# Patient Record
Sex: Female | Born: 1985 | Race: Black or African American | Hispanic: No | Marital: Single | Smoking: Former smoker
Health system: Southern US, Community
[De-identification: ages and names within clinical notes are randomized; demographics above are authoritative.]

## PROBLEM LIST (undated history)

## (undated) DIAGNOSIS — F419 Anxiety disorder, unspecified: Secondary | ICD-10-CM

## (undated) DIAGNOSIS — S37039A Laceration of unspecified kidney, unspecified degree, initial encounter: Secondary | ICD-10-CM

## (undated) DIAGNOSIS — R51 Headache: Secondary | ICD-10-CM

## (undated) DIAGNOSIS — F329 Major depressive disorder, single episode, unspecified: Secondary | ICD-10-CM

## (undated) DIAGNOSIS — Q8500 Neurofibromatosis, unspecified: Secondary | ICD-10-CM

## (undated) DIAGNOSIS — S82891A Other fracture of right lower leg, initial encounter for closed fracture: Secondary | ICD-10-CM

## (undated) DIAGNOSIS — R519 Headache, unspecified: Secondary | ICD-10-CM

## (undated) DIAGNOSIS — F32A Depression, unspecified: Secondary | ICD-10-CM

## (undated) HISTORY — DX: Laceration of unspecified kidney, unspecified degree, initial encounter: S37.039A

## (undated) HISTORY — DX: Headache: R51

## (undated) HISTORY — DX: Headache, unspecified: R51.9

## (undated) HISTORY — DX: Depression, unspecified: F32.A

## (undated) HISTORY — DX: Major depressive disorder, single episode, unspecified: F32.9

## (undated) HISTORY — DX: Other fracture of right lower leg, initial encounter for closed fracture: S82.891A

## (undated) HISTORY — DX: Anxiety disorder, unspecified: F41.9

---
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
## 2006-08-31 ENCOUNTER — Emergency Department (HOSPITAL_COMMUNITY): Admission: EM | Admit: 2006-08-31 | Discharge: 2006-09-01 | Payer: Self-pay | Admitting: Emergency Medicine

## 2006-10-11 ENCOUNTER — Inpatient Hospital Stay (HOSPITAL_COMMUNITY): Admission: AD | Admit: 2006-10-11 | Discharge: 2006-10-11 | Payer: Self-pay | Admitting: Obstetrics

## 2006-11-10 DIAGNOSIS — O1410 Severe pre-eclampsia, unspecified trimester: Secondary | ICD-10-CM

## 2010-05-12 LAB — SICKLE CELL SCREEN: Sickle Cell Screen: NORMAL

## 2013-05-06 ENCOUNTER — Encounter (HOSPITAL_COMMUNITY): Payer: Self-pay | Admitting: Emergency Medicine

## 2013-05-06 ENCOUNTER — Emergency Department (INDEPENDENT_AMBULATORY_CARE_PROVIDER_SITE_OTHER)
Admission: EM | Admit: 2013-05-06 | Discharge: 2013-05-06 | Disposition: A | Discharge status: Home / Self Care | Payer: Medicaid Other | Source: Home / Self Care

## 2013-05-06 DIAGNOSIS — R110 Nausea: Secondary | ICD-10-CM

## 2013-05-06 DIAGNOSIS — R109 Unspecified abdominal pain: Secondary | ICD-10-CM

## 2013-05-06 LAB — LIPASE, BLOOD: Lipase: 20 U/L (ref 11–59)

## 2013-05-06 LAB — POCT PREGNANCY, URINE: Preg Test, Ur: NEGATIVE

## 2013-05-06 LAB — POCT I-STAT, CHEM 8
BUN: 7 mg/dL (ref 6–23)
Chloride: 107 mEq/L (ref 96–112)
Creatinine, Ser: 0.7 mg/dL (ref 0.50–1.10)
Glucose, Bld: 103 mg/dL — ABNORMAL HIGH (ref 70–99)
HCT: 37 % (ref 36.0–46.0)
Potassium: 4 mEq/L (ref 3.5–5.1)
Sodium: 140 mEq/L (ref 135–145)

## 2013-05-06 LAB — HEPATIC FUNCTION PANEL
AST: 11 U/L (ref 0–37)
Albumin: 3.7 g/dL (ref 3.5–5.2)
Total Protein: 7.2 g/dL (ref 6.0–8.3)

## 2013-05-06 MED ORDER — ONDANSETRON 4 MG PO TBDP: 4.0000 mg | ORAL_TABLET | Freq: Three times a day (TID) | ORAL | Status: DC | PRN

## 2013-05-06 NOTE — ED Notes (Signed)
Pt c/o abdominal cramping, nausea, and headaches x 1 day. Yesterday had a bowel movement and feces was white and pale. Felt very lightheaded and dizzy after bowel movement. No changes in diet. Patient is alert and oriented.

## 2013-05-06 NOTE — ED Provider Notes (Signed)
History     CSN: [id number]  Arrival date & time 05/06/13  1325   None     Chief Complaint  Patient presents with  . GI Problem    (Consider location/radiation/quality/duration/timing/severity/associated sxs/prior treatment) HPI Comments: Pt presents c/o nausea, abdominal cramping, abdominal pain since yesterday.  States yesterday she had a stool that was white/gray and that today it was very pasty and abnormal.  She has never had anything like this happen before with her.  Denies any vomiting, fever, chills, SOB, swelling in feet or hands.  She is sexually active in the past 3 weeks.    Patient is a 27 y.o. female presenting with GI illness.  GI Problem    History reviewed. No pertinent past medical history.  History reviewed. No pertinent past surgical history.  No family history on file.  History  Substance Use Topics  . Smoking status: Current Some Day Smoker -- 0.00 packs/day    Types: Cigarettes  . Smokeless tobacco: Not on file  . Alcohol Use: Yes     Comment: occasionally    OB History   Grav Para Term Preterm Abortions TAB SAB Ect Mult Living                  Review of Systems  Constitutional: Negative.  Negative for chills and fatigue.  HENT: Negative.   Eyes: Negative.   Respiratory: Negative.   Cardiovascular: Negative.   Gastrointestinal: Positive for abdominal distention (cramping ).       See HPI   Endocrine: Negative.   Genitourinary: Negative.   Musculoskeletal: Negative.   Skin: Negative.   Allergic/Immunologic: Negative.   Neurological: Negative.   Hematological: Negative.   Psychiatric/Behavioral: Negative.     Allergies  Review of patient's allergies indicates no known allergies.  Home Medications   Current Outpatient Rx  Name  Route  Sig  Dispense  Refill  . ondansetron (ZOFRAN-ODT) 4 MG disintegrating tablet   Oral   Take 1 tablet (4 mg total) by mouth every 8 (eight) hours as needed for nausea.   10 tablet   0     BP  118/57  Pulse 66  Temp(Src) 98.8 F (37.1 C) (Oral)  Resp 16  SpO2 100%  LMP 04/09/2013  Physical Exam  Nursing note and vitals reviewed. Constitutional: She is oriented to person, place, and time. Vital signs are normal. She appears well-developed and well-nourished. No distress.  HENT:  Head: Normocephalic and atraumatic.  Right Ear: External ear normal.  Left Ear: External ear normal.  Nose: Nose normal.  Eyes: Conjunctivae and EOM are normal. Pupils are equal, round, and reactive to light. Right eye exhibits no discharge. Left eye exhibits no discharge.  Neck: Normal range of motion. Neck supple. No JVD present. No tracheal deviation present.  Cardiovascular: Normal rate, regular rhythm and normal heart sounds.  Exam reveals no gallop and no friction rub.   No murmur heard. Pulmonary/Chest: Effort normal and breath sounds normal. No stridor. No respiratory distress. She has no wheezes. She has no rales. She exhibits no tenderness.  Abdominal: Soft. Normal appearance and bowel sounds are normal. She exhibits no distension. There is no hepatosplenomegaly. There is tenderness in the epigastric area and left lower quadrant. There is no guarding, no CVA tenderness, no tenderness at McBurney's point and negative Murphy's sign.  Musculoskeletal: Normal range of motion. She exhibits no edema and no tenderness.  Neurological: She is alert and oriented to person, place, and time. She has  normal strength. No cranial nerve deficit.  Skin: Skin is warm and dry. No rash noted. She is not diaphoretic. No erythema. No pallor.  Psychiatric: She has a normal mood and affect. Her behavior is normal. Judgment normal.    ED Course  Procedures (including critical care time)  Labs Reviewed  POCT I-STAT, CHEM 8 - Abnormal; Notable for the following:    Glucose, Bld 103 (*)    All other components within normal limits  HEPATIC FUNCTION PANEL  LIPASE, BLOOD  POCT PREGNANCY, URINE   No results  found.   1. Abdominal pain   2. Nausea       MDM  Pt has benign PE.  No clear reason for her symptoms, suspect some form of mild gastroenteritis.  Will give her Rx for zofran for any nausea and will call her about the labs when they come back.  Clear/soft diet until labs come back.  Return if worsening or f/u with PCP     Graylon Good, PA-C 05/06/13 1608  Graylon Good, PA-C 05/06/13 1711  Addendum: Labs came back normal.  Attempted to call pt to inform but phone number disconnected.     Graylon Good, PA-C 05/07/13 1021

## 2013-05-07 NOTE — ED Provider Notes (Signed)
Medical screening examination/treatment/procedure(s) were performed by resident physician or non-physician practitioner and as supervising physician I was immediately available for consultation/collaboration.   KINDL,JAMES DOUGLAS MD.   James D Kindl, MD 05/07/13 1409 

## 2014-02-27 ENCOUNTER — Emergency Department (HOSPITAL_COMMUNITY): Payer: Medicaid Other

## 2014-02-27 ENCOUNTER — Encounter (HOSPITAL_COMMUNITY): Payer: Self-pay | Admitting: Emergency Medicine

## 2014-02-27 ENCOUNTER — Emergency Department (HOSPITAL_COMMUNITY)
Admission: EM | Admit: 2014-02-27 | Discharge: 2014-02-27 | Disposition: A | Discharge status: Home / Self Care | Payer: Medicaid Other | Attending: Emergency Medicine | Admitting: Emergency Medicine

## 2014-02-27 ENCOUNTER — Emergency Department (HOSPITAL_COMMUNITY)
Admission: EM | Admit: 2014-02-27 | Discharge: 2014-02-27 | Disposition: A | Discharge status: Hospice | Payer: Medicaid Other | Source: Home / Self Care | Attending: Family Medicine | Admitting: Family Medicine

## 2014-02-27 DIAGNOSIS — F17200 Nicotine dependence, unspecified, uncomplicated: Secondary | ICD-10-CM | POA: Insufficient documentation

## 2014-02-27 DIAGNOSIS — R1013 Epigastric pain: Secondary | ICD-10-CM

## 2014-02-27 DIAGNOSIS — R110 Nausea: Secondary | ICD-10-CM | POA: Insufficient documentation

## 2014-02-27 DIAGNOSIS — R109 Unspecified abdominal pain: Secondary | ICD-10-CM

## 2014-02-27 DIAGNOSIS — L259 Unspecified contact dermatitis, unspecified cause: Secondary | ICD-10-CM | POA: Insufficient documentation

## 2014-02-27 LAB — COMPREHENSIVE METABOLIC PANEL
ALT: 10 U/L (ref 0–35)
AST: 12 U/L (ref 0–37)
Albumin: 3.7 g/dL (ref 3.5–5.2)
Alkaline Phosphatase: 78 U/L (ref 39–117)
BUN: 7 mg/dL (ref 6–23)
CALCIUM: 8.9 mg/dL (ref 8.4–10.5)
CO2: 22 meq/L (ref 19–32)
CREATININE: 0.61 mg/dL (ref 0.50–1.10)
Chloride: 106 mEq/L (ref 96–112)
GLUCOSE: 87 mg/dL (ref 70–99)
Potassium: 4 mEq/L (ref 3.7–5.3)
SODIUM: 140 meq/L (ref 137–147)
TOTAL PROTEIN: 7.5 g/dL (ref 6.0–8.3)
Total Bilirubin: 0.2 mg/dL — ABNORMAL LOW (ref 0.3–1.2)

## 2014-02-27 LAB — POCT URINALYSIS DIP (DEVICE)
Glucose, UA: NEGATIVE mg/dL
Ketones, ur: NEGATIVE mg/dL
NITRITE: NEGATIVE
PH: 5.5 (ref 5.0–8.0)
PROTEIN: 100 mg/dL — AB
Specific Gravity, Urine: >=1.03 (ref 1.005–1.030)
UROBILINOGEN UA: 0.2 mg/dL (ref 0.0–1.0)

## 2014-02-27 LAB — CBC WITH DIFFERENTIAL/PLATELET
BASOS ABS: 0 10*3/uL (ref 0.0–0.1)
Basophils Relative: 1 % (ref 0–1)
EOS ABS: 0.1 10*3/uL (ref 0.0–0.7)
EOS PCT: 2 % (ref 0–5)
HEMATOCRIT: 35.9 % — AB (ref 36.0–46.0)
Hemoglobin: 11.6 g/dL — ABNORMAL LOW (ref 12.0–15.0)
Lymphocytes Relative: 29 % (ref 12–46)
Lymphs Abs: 1.9 10*3/uL (ref 0.7–4.0)
MCH: 24.9 pg — AB (ref 26.0–34.0)
MCHC: 32.3 g/dL (ref 30.0–36.0)
MCV: 77 fL — AB (ref 78.0–100.0)
MONO ABS: 0.9 10*3/uL (ref 0.1–1.0)
Monocytes Relative: 13 % — ABNORMAL HIGH (ref 3–12)
Neutro Abs: 3.7 10*3/uL (ref 1.7–7.7)
Neutrophils Relative %: 56 % (ref 43–77)
PLATELETS: 388 10*3/uL (ref 150–400)
RBC: 4.66 MIL/uL (ref 3.87–5.11)
RDW: 16.7 % — AB (ref 11.5–15.5)
WBC: 6.7 10*3/uL (ref 4.0–10.5)

## 2014-02-27 LAB — URINALYSIS, ROUTINE W REFLEX MICROSCOPIC
BILIRUBIN URINE: NEGATIVE
Glucose, UA: NEGATIVE mg/dL
Hgb urine dipstick: NEGATIVE
Ketones, ur: NEGATIVE mg/dL
NITRITE: NEGATIVE
PH: 7 (ref 5.0–8.0)
Protein, ur: NEGATIVE mg/dL
SPECIFIC GRAVITY, URINE: 1.025 (ref 1.005–1.030)
UROBILINOGEN UA: 1 mg/dL (ref 0.0–1.0)

## 2014-02-27 LAB — POCT PREGNANCY, URINE: Preg Test, Ur: NEGATIVE

## 2014-02-27 LAB — LIPASE, BLOOD: Lipase: 15 U/L (ref 11–59)

## 2014-02-27 LAB — URINE MICROSCOPIC-ADD ON

## 2014-02-27 MED ORDER — GI COCKTAIL ~~LOC~~
30.0000 mL | Freq: Once | ORAL | Status: AC
  Administered 2014-02-27: 30 mL via ORAL
  Filled 2014-02-27: qty 30

## 2014-02-27 MED ORDER — SULFAMETHOXAZOLE-TRIMETHOPRIM 800-160 MG PO TABS: 1.0000 | ORAL_TABLET | Freq: Two times a day (BID) | ORAL | Status: AC

## 2014-02-27 MED ORDER — OMEPRAZOLE 20 MG PO CPDR: 20.0000 mg | DELAYED_RELEASE_CAPSULE | Freq: Every day | ORAL | Status: DC

## 2014-02-27 MED ORDER — ACETAMINOPHEN 325 MG PO TABS
650.0000 mg | ORAL_TABLET | Freq: Once | ORAL | Status: AC
  Administered 2014-02-27: 650 mg via ORAL
  Filled 2014-02-27: qty 2

## 2014-02-27 NOTE — ED Notes (Signed)
C/o upper mid abd pain States as if food is not digesting  Feels as if a heavy weight is on the center of her abd area radiating upward under breast No medications or treatments tried No distress at this time

## 2014-02-27 NOTE — ED Provider Notes (Signed)
Medical screening examination/treatment/procedure(s) were performed by resident physician or non-physician practitioner and as supervising physician I was immediately available for consultation/collaboration.   KINDL,JAMES DOUGLAS MD.   James D Kindl, MD 02/27/14 1745 

## 2014-02-27 NOTE — Discharge Instructions (Signed)
Abdominal Pain, Adult °Many things can cause abdominal pain. Usually, abdominal pain is not caused by a disease and will improve without treatment. It can often be observed and treated at home. Your health care provider will do a physical exam and possibly order blood tests and X-rays to help determine the seriousness of your pain. However, in many cases, more time must pass before a clear cause of the pain can be found. Before that point, your health care provider may not know if you need more testing or further treatment. °HOME CARE INSTRUCTIONS  °Monitor your abdominal pain for any changes. The following actions may help to alleviate any discomfort you are experiencing: °· Only take over-the-counter or prescription medicines as directed by your health care provider. °· Do not take laxatives unless directed to do so by your health care provider. °· Try a clear liquid diet (broth, tea, or water) as directed by your health care provider. Slowly move to a bland diet as tolerated. °SEEK MEDICAL CARE IF: °· You have unexplained abdominal pain. °· You have abdominal pain associated with nausea or diarrhea. °· You have pain when you urinate or have a bowel movement. °· You experience abdominal pain that wakes you in the night. °· You have abdominal pain that is worsened or improved by eating food. °· You have abdominal pain that is worsened with eating fatty foods. °SEEK IMMEDIATE MEDICAL CARE IF:  °· Your pain does not go away within 2 hours. °· You have a fever. °· You keep throwing up (vomiting). °· Your pain is felt only in portions of the abdomen, such as the right side or the left lower portion of the abdomen. °· You pass bloody or black tarry stools. °MAKE SURE YOU: °· Understand these instructions.   °· Will watch your condition.   °· Will get help right away if you are not doing well or get worse.   °Document Released: 09/12/2005 Document Revised: 09/23/2013 Document Reviewed: 08/12/2013 °ExitCare® Patient  Information ©2014 [employer]. ° °

## 2014-02-27 NOTE — ED Notes (Signed)
Pt informed to update number with registration at discharge desk, in case pt needed new abx from urine culture.

## 2014-02-27 NOTE — ED Notes (Signed)
Pt c/o diffuse upper abdominal pain described as "heaviness" after eating food for the past 3 weeks associated with intermittent nausea. Pt denies CP, SOB, blurry vision, weakness, vomiting and diarrhea. [facility]. Pt in NAD, respirations equal and labored, steady gait, skin warm and dry.

## 2014-02-27 NOTE — ED Notes (Signed)
Pt at U/S

## 2014-02-27 NOTE — ED Provider Notes (Signed)
CSN: [id number]     Arrival date & time 02/27/14  1157 History   First MD Initiated Contact with Patient 02/27/14 1213     Chief Complaint  Patient presents with  . Abdominal Pain     (Consider location/radiation/quality/duration/timing/severity/associated sxs/prior Treatment) Patient is a 28 y.o. female presenting with abdominal pain. The history is provided by the patient. No language interpreter was used.  Abdominal Pain Pain location:  Epigastric Pain quality: aching   Pain radiates to:  Does not radiate Duration:  2 weeks Timing:  Intermittent Context: not previous surgeries, not recent illness, not sick contacts and not suspicious food intake   Associated symptoms: nausea   Associated symptoms: no chills, no constipation, no diarrhea, no dysuria, no fatigue, no fever, no hematuria, no shortness of breath and no vomiting    Pt is a 28 year old female who [facility] with complaints of epigastric pain and discomfort. She reports that she has been having an aching sensation in her epigastrum for the last 2 weeks on and off. She reports that she also has had a burning sensation up in to the center of her chest. She reports that it is sometimes worse at night and may be related to food but she is unsure. She denies any associated shortness of breath, difficulty breathing or recent illness. She denies any vomiting, diarrhea or constipation. She reports that when she has this epigastric pain she experiences some mild nausea. She denies radiation of pain into her back. No history of any medical problems. No surgical history.   History reviewed. No pertinent past medical history. History reviewed. No pertinent past surgical history. No family history on file. History  Substance Use Topics  . Smoking status: Current Some Day Smoker -- 0.00 packs/day    Types: Cigarettes  . Smokeless tobacco: Not on file  . Alcohol Use: Yes     Comment: occasionally   OB History   Grav Para Term Preterm Abortions TAB SAB Ect Mult Living                 Review of Systems  Constitutional: Negative for fever, chills and fatigue.  Respiratory: Negative for shortness of breath.   Gastrointestinal: Positive for nausea and abdominal pain. Negative for vomiting, diarrhea and constipation.  Genitourinary: Negative for dysuria, hematuria and difficulty urinating.  Musculoskeletal: Negative for back pain.  All other systems reviewed and are negative.      Allergies  Review of patient's allergies indicates no known allergies.  Home Medications   Current Outpatient Rx  Name  Route  Sig  Dispense  Refill  . omeprazole (PRILOSEC) 20 MG capsule   Oral   Take 1 capsule (20 mg total) by mouth daily.   30 capsule   1   . sulfamethoxazole-trimethoprim (BACTRIM DS,SEPTRA DS) 800-160 MG per tablet   Oral   Take 1 tablet by mouth 2 (two) times daily.   6 tablet   0    BP 103/50  Pulse 85  Temp(Src) 99.1 F (37.3 C) (Oral)  Resp 18  SpO2 100%  LMP 02/09/2014 Physical Exam  Nursing note and vitals reviewed. Constitutional: She is oriented to person, place, and time. She appears well-developed and well-nourished.  Well-appearing  HENT:  Head: Normocephalic and atraumatic.  Eyes: Conjunctivae and EOM are normal.  Neck: Normal range of motion. Neck supple. No JVD present. No tracheal deviation present. No thyromegaly present.  Cardiovascular: Normal rate, regular rhythm and normal heart  sounds.   Pulmonary/Chest: Effort normal and breath sounds normal. No respiratory distress. She has no wheezes.  Abdominal: Soft. Normal appearance and bowel sounds are normal. There is tenderness in the epigastric area. There is no rigidity, no rebound, no guarding, no tenderness at McBurney's point and negative Murphy's sign.  Musculoskeletal: Normal range of motion.  Lymphadenopathy:    She has no cervical adenopathy.  Neurological: She is alert and oriented to person, place,  and time. No cranial nerve deficit. Coordination normal.  Skin: Skin is warm and dry.  Left arm, ecchymosis, small and scattered on upper arm. Pt reports due to eczema where she has been scratching.  Psychiatric: She has a normal mood and affect. Her behavior is normal. Judgment and thought content normal.    ED Course  Procedures (including critical care time) Labs Review Labs Reviewed  CBC WITH DIFFERENTIAL - Abnormal; Notable for the following:    Hemoglobin 11.6 (*)    HCT 35.9 (*)    MCV 77.0 (*)    MCH 24.9 (*)    RDW 16.7 (*)    Monocytes Relative 13 (*)    All other components within normal limits  COMPREHENSIVE METABOLIC PANEL - Abnormal; Notable for the following:    Total Bilirubin 0.2 (*)    All other components within normal limits  URINALYSIS, ROUTINE W REFLEX MICROSCOPIC - Abnormal; Notable for the following:    APPearance CLOUDY (*)    Leukocytes, UA LARGE (*)    All other components within normal limits  URINE MICROSCOPIC-ADD ON - Abnormal; Notable for the following:    Squamous Epithelial / LPF MANY (*)    Bacteria, UA MANY (*)    All other components within normal limits  URINE CULTURE  LIPASE, BLOOD   Imaging Review No results found.   EKG Interpretation None      MDM   Final diagnoses:  Epigastric abdominal pain    Patient reports feeling much better after GI cocktail. Negative abdominal ultrasound. Patient is afebrile with benign abdominal exam. Urinalysis consistent with UTI. Discussed diet modifications for GERD. Discussed overall plan of care and patient agrees. Prescriptions given for omeprazole and Bactrim DS. Return precautions given.      Irish Elders, NP 03/02/14 [phone]

## 2014-02-27 NOTE — ED Notes (Signed)
Pt informed [facility] e-signed rx for keflex and pyridum, and pt had option between those meds and bactrim here in ED. RN informed of use and SE. PT educated on GERD and UTI prevention.

## 2014-02-27 NOTE — ED Notes (Signed)
Pt presents to department for evaluation of middle epigastric pain. Ongoing for several months. [facility]. States she feels like food isn't digesting properly. 8/10 pain upon arrival to ED. Pt is alert and oriented x4.

## 2014-02-27 NOTE — ED Notes (Signed)
[facility]see results)

## 2014-02-27 NOTE — ED Notes (Signed)
US called sts appx time 30 min pt updated.

## 2014-02-27 NOTE — ED Provider Notes (Signed)
CSN: [id number]     Arrival date & time 02/27/14  1004 History   First MD Initiated Contact with Patient 02/27/14 1059     Chief Complaint  Patient presents with  . Abdominal Pain  . Gastrophageal Reflux    Patient is a 28 y.o. female presenting with abdominal pain and GERD. The history is provided by the patient.  Abdominal Pain Pain location:  Epigastric Pain quality: bloating, fullness, heavy, pressure and sharp   Pain radiates to:  Does not radiate Pain severity:  Severe Onset quality:  Sudden Duration:  2 weeks Timing:  Intermittent Progression:  Worsening Chronicity:  Recurrent Context: eating   Context: not alcohol use, not awakening from sleep, not diet changes, not laxative use, not medication withdrawal, not previous surgeries, not recent illness, not retching, not sick contacts, not suspicious food intake and not trauma   Relieved by:  Nothing Worsened by:  Eating Associated symptoms: nausea   Associated symptoms: no anorexia, no belching, no constipation, no cough, no diarrhea, no fever and no vomiting   Risk factors: obesity   Risk factors: no aspirin use, not elderly, has not had multiple surgeries, no NSAID use, not pregnant and no recent hospitalization   Gastrophageal Reflux Associated symptoms include abdominal pain.  Pt reports intermittent epigastric area pain after eating that has worsened over the last 2 weeks. She first began to notice this pain in November. Starts soon after eating. States it feels like she is not digesting her food. Pain will last several hours then resolve. Pain is associated with nausea. Denies vomiting, diarrhea or fever.   History reviewed. No pertinent past medical history. History reviewed. No pertinent past surgical history. History reviewed. No pertinent family history. History  Substance Use Topics  . Smoking status: Current Some Day Smoker -- 0.00 packs/day    Types: Cigarettes  . Smokeless tobacco: Not on file  . Alcohol Use:  Yes     Comment: occasionally   OB History   Grav Para Term Preterm Abortions TAB SAB Ect Mult Living                 Review of Systems  Constitutional: Negative for fever.  Respiratory: Negative for cough.   Gastrointestinal: Positive for nausea and abdominal pain. Negative for vomiting, diarrhea, constipation, abdominal distention and anorexia.  All other systems reviewed and are negative.    Allergies  Review of patient's allergies indicates no known allergies.  Home Medications   Current Outpatient Rx  Name  Route  Sig  Dispense  Refill  . omeprazole (PRILOSEC) 20 MG capsule   Oral   Take 1 capsule (20 mg total) by mouth daily.   30 capsule   1   . sulfamethoxazole-trimethoprim (BACTRIM DS,SEPTRA DS) 800-160 MG per tablet   Oral   Take 1 tablet by mouth 2 (two) times daily.   6 tablet   0    BP 138/75  Pulse 79  Temp(Src) 98.4 F (36.9 C) (Oral)  Resp 18  SpO2 100%  LMP 02/09/2014 Physical Exam  Constitutional: She is oriented to person, place, and time. She appears well-developed and well-nourished. No distress.  HENT:  Head: Normocephalic and atraumatic.  Eyes: Conjunctivae are normal.  Cardiovascular: Normal rate and regular rhythm.   Pulmonary/Chest: Effort normal and breath sounds normal.  Abdominal: Soft. Bowel sounds are normal. She exhibits no distension and no mass. There is tenderness in the epigastric area. There is no rigidity, no rebound, no guarding, no tenderness  at McBurney's point and negative Murphy's sign. No hernia.  Neurological: She is alert and oriented to person, place, and time.  Skin: Skin is warm and dry.  Psychiatric: She has a normal mood and affect.    ED Course  Procedures (including critical care time) Labs Review Labs Reviewed  POCT URINALYSIS DIP (DEVICE) - Abnormal; Notable for the following:    Bilirubin Urine SMALL (*)    Hgb urine dipstick SMALL (*)    Protein, ur 100 (*)    Leukocytes, UA LARGE (*)    All  other components within normal limits  POCT PREGNANCY, URINE   Imaging Review [place] Abdomen Complete  02/27/2014   CLINICAL DATA:  Abdominal pain  EXAM: ULTRASOUND ABDOMEN COMPLETE  COMPARISON:  None.  FINDINGS: Gallbladder:  No gallstones or wall thickening visualized. There is no pericholecystic fluid. No sonographic Murphy sign noted.  Common bile duct:  Diameter: 3 mm. There is no intrahepatic, common hepatic, or common bile duct dilatation.  Liver:  No focal lesion identified. Within normal limits in parenchymal echogenicity.  IVC:  No abnormality visualized.  Pancreas:  No appreciable mass or inflammatory focus.  Spleen:  Size and appearance within normal limits.  Right Kidney:  Length: 10.4 cm. Echogenicity within normal limits. No mass or hydronephrosis visualized.  Left Kidney:  Length: 10.3 cm. Echogenicity within normal limits. No mass or hydronephrosis visualized.  Abdominal aorta:  No aneurysm visualized.  Other findings:  No demonstrable ascites.  IMPRESSION: Study within normal limits.   Electronically Signed   By: Bretta BangWilliam  Woodruff M.D.   On: 02/27/2014 14:19     MDM   1. Abdominal pain    4+ month h/o epigastric area pain after eating that is associated w/ nausea w/o vomiting. Pain much worse and persistent over the last 2 weeks. Very TTP over the epigastric region. No known h/o GERD. Suspicious for Cholelithiasis/Cholecystitis though couold alos be GERD related. Will transfer to [facility] for abd u/s to r/o gallstones. Would probably treat  For GERD if negative findings. Discussed w/ Dr Artis FlockKindl who is in agreement w/ plan.    Leanne ChangKatherine P Braven Wolk, NP 02/27/14 [phone]

## 2014-02-27 NOTE — ED Notes (Signed)
RN retrieved patient from US. Pt in NAD

## 2014-02-27 NOTE — Discharge Instructions (Signed)
Diet for Gastroesophageal Reflux Disease, Adult Reflux (acid reflux) is when acid from your stomach flows up into the esophagus. When acid comes in contact with the esophagus, the acid causes irritation and soreness (inflammation) in the esophagus. When reflux happens often or so severely that it causes damage to the esophagus, it is called gastroesophageal reflux disease (GERD). Nutrition therapy can help ease the discomfort of GERD. FOODS OR DRINKS TO AVOID OR LIMIT  Smoking or chewing tobacco. Nicotine is one of the most potent stimulants to acid production in the gastrointestinal tract.  Caffeinated and decaffeinated coffee and black tea.  Regular or low-calorie carbonated beverages or energy drinks (caffeine-free carbonated beverages are allowed).   Strong spices, such as black pepper, white pepper, red pepper, cayenne, curry powder, and chili powder.  Peppermint or spearmint.  Chocolate.  High-fat foods, including meats and fried foods. Extra added fats including oils, butter, salad dressings, and nuts. Limit these to less than 8 tsp per day.  Fruits and vegetables if they are not tolerated, such as citrus fruits or tomatoes.  Alcohol.  Any food that seems to aggravate your condition. If you have questions regarding your diet, call your caregiver or a registered dietitian. OTHER THINGS THAT MAY HELP GERD INCLUDE:   Eating your meals slowly, in a relaxed setting.  Eating 5 to 6 small meals per day instead of 3 large meals.  Eliminating food for a period of time if it causes distress.  Not lying down until 3 hours after eating a meal.  Keeping the head of your bed raised 6 to 9 inches (15 to 23 cm) by using a foam wedge or blocks under the legs of the bed. Lying flat may make symptoms worse.  Being physically active. Weight loss may be helpful in reducing reflux in overweight or obese adults.  Wear loose fitting clothing EXAMPLE MEAL PLAN This meal plan is approximately  2,000 calories based on https://www.[place].org/ChooseMyPlate.gov meal planning guidelines. Breakfast   cup cooked oatmeal.  1 cup strawberries.  1 cup low-fat milk.  1 oz almonds. Snack  1 cup cucumber slices.  6 oz yogurt (made from low-fat or fat-free milk). Lunch  2 slice whole-wheat bread.  2 oz sliced [place].  2 tsp mayonnaise.  1 cup blueberries.  1 cup snap peas. Snack  6 whole-wheat crackers.  1 oz string cheese. Dinner   cup brown rice.  1 cup mixed veggies.  1 tsp olive oil.  3 oz grilled fish. Document Released: 12/03/2005 Document Revised: 02/25/2012 Document Reviewed: 10/19/2011 [facility] Patient Information 2014 [place], [place].   Take omeprazole as prescribed Establish and follow-up with PCP

## 2014-02-28 LAB — URINE CULTURE: Special Requests: NORMAL

## 2014-03-02 NOTE — ED Provider Notes (Signed)
Medical screening examination/treatment/procedure(s) were performed by non-physician practitioner and as supervising physician I was immediately available for consultation/collaboration.   EKG Interpretation None        Mikelle Myrick J. Fillmore Bynum, MD 03/02/14 0025 

## 2014-05-25 IMAGING — US US ABDOMEN COMPLETE
1 series · 14 of 25 positions shown · non-contrast
Comparison: None.

CLINICAL DATA: Abdominal pain

EXAM:
ULTRASOUND ABDOMEN COMPLETE

[Series 1: us abdomen complete · 0.22mm/px · 14 of 64 slices shown]
[im 1/64]
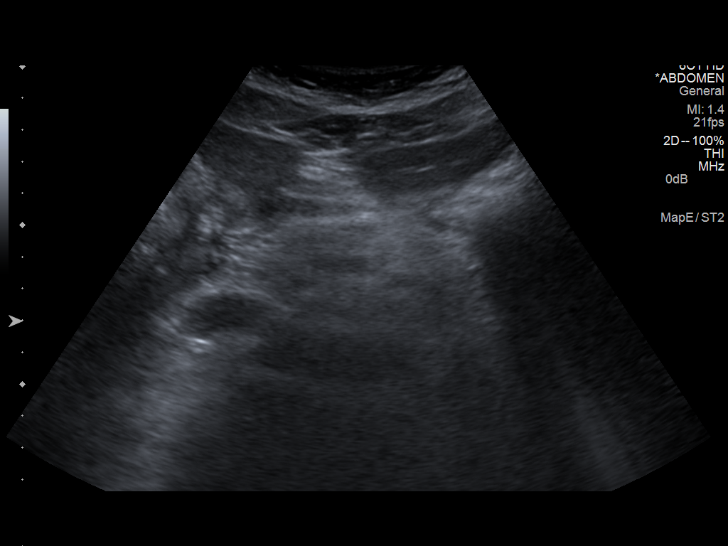
[im 6/64]
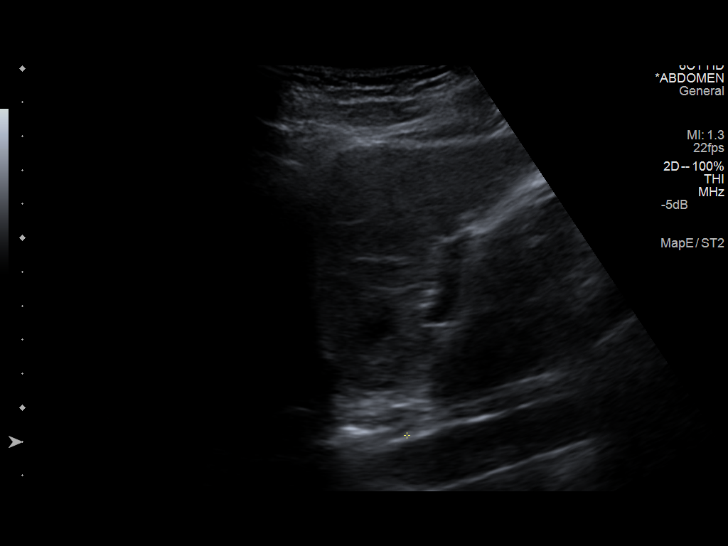
[im 11/64]
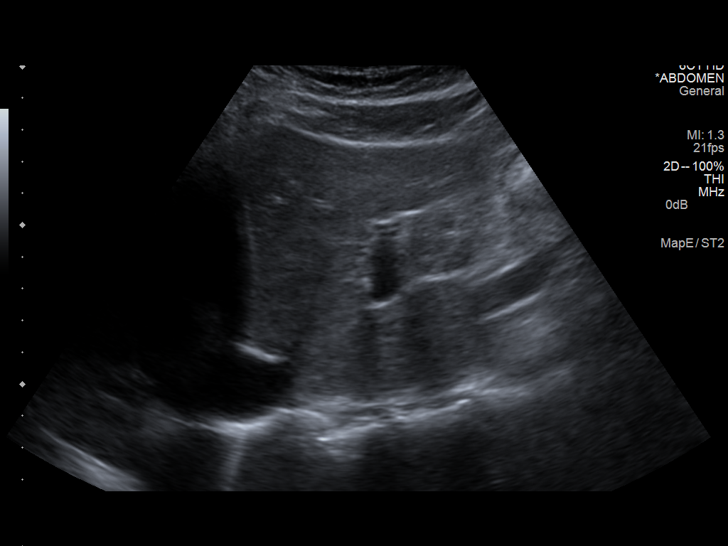
[im 16/64]
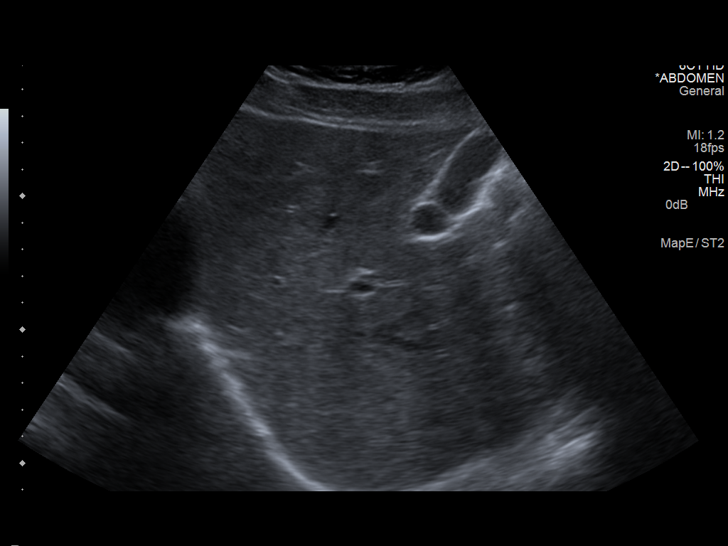
[im 22/64]
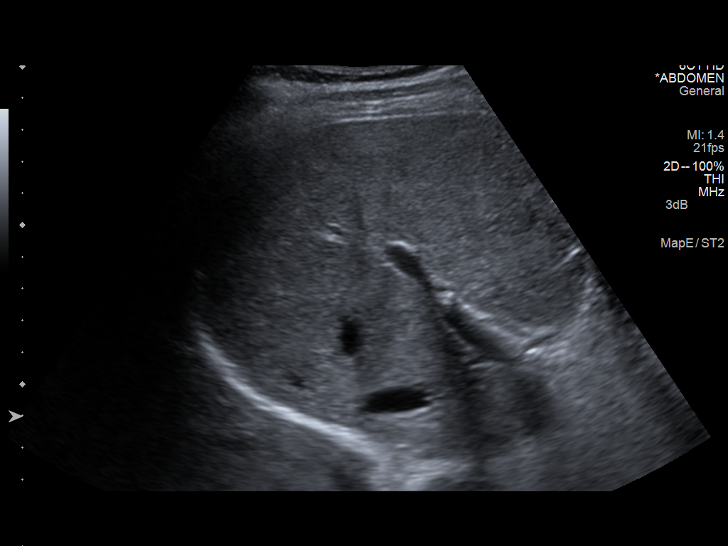
[im 24/64]
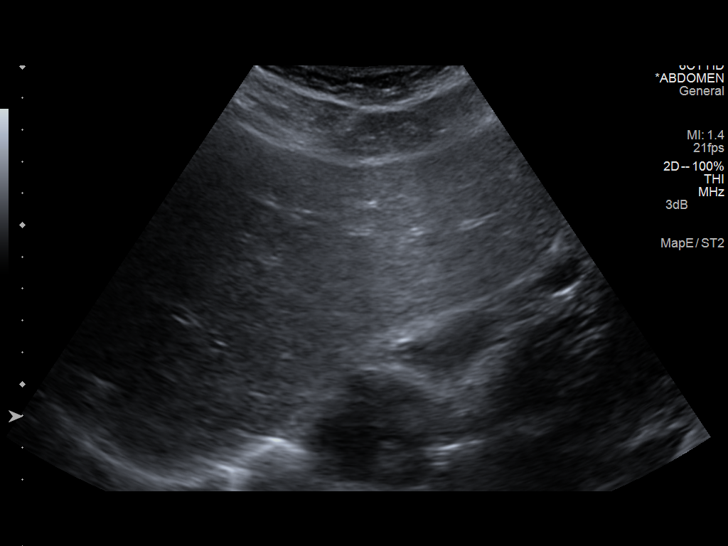
[im 29/64]
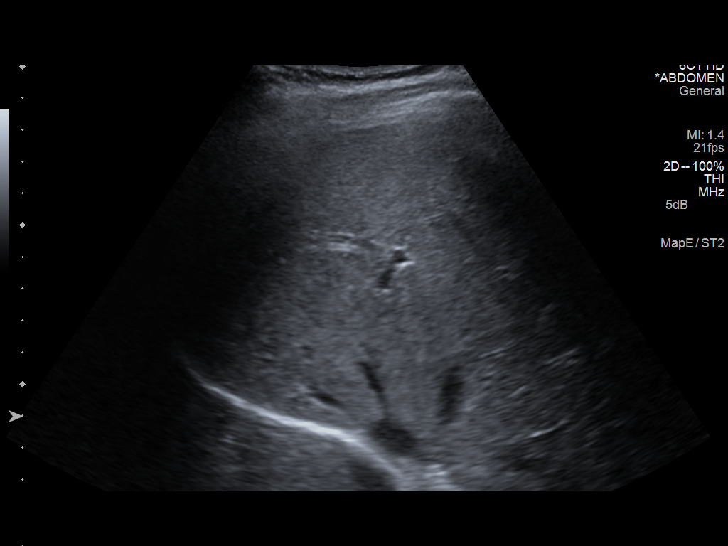
[im 35/64]
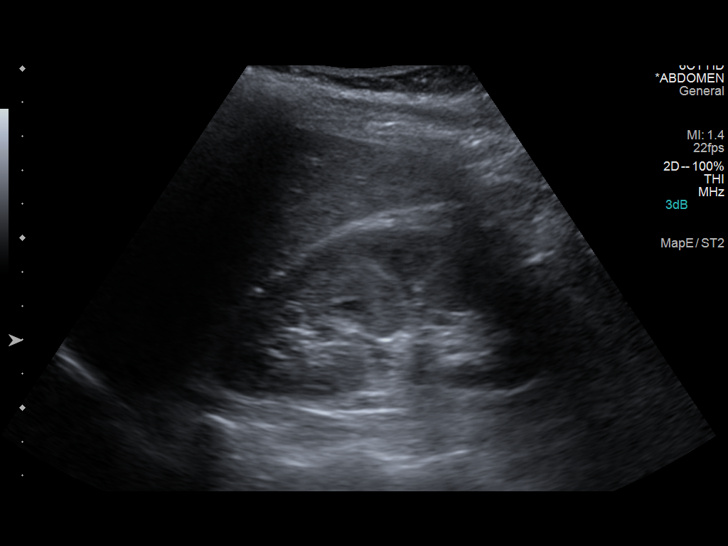
[im 40/64]
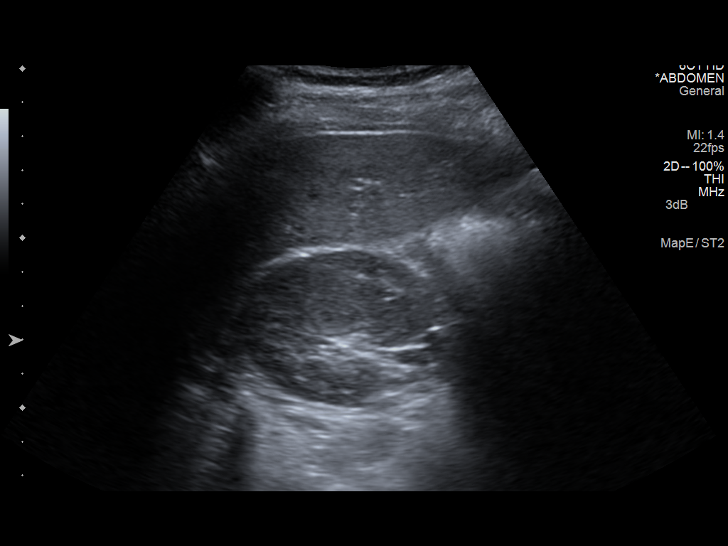
[im 43/64]
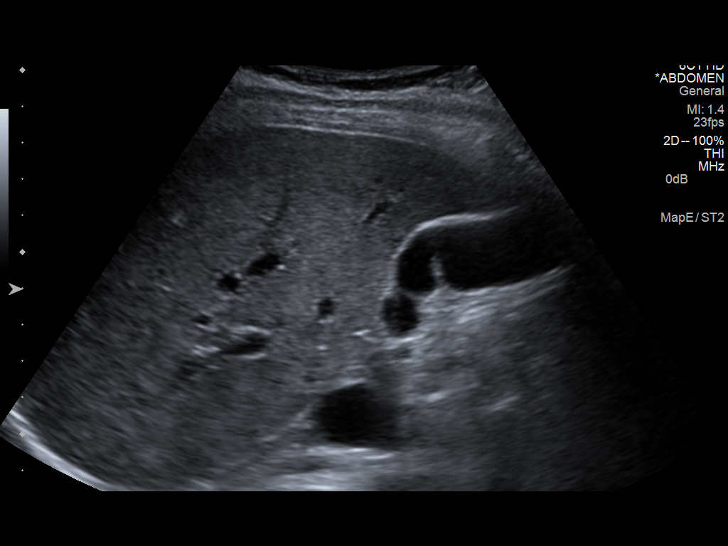
[im 48/64]
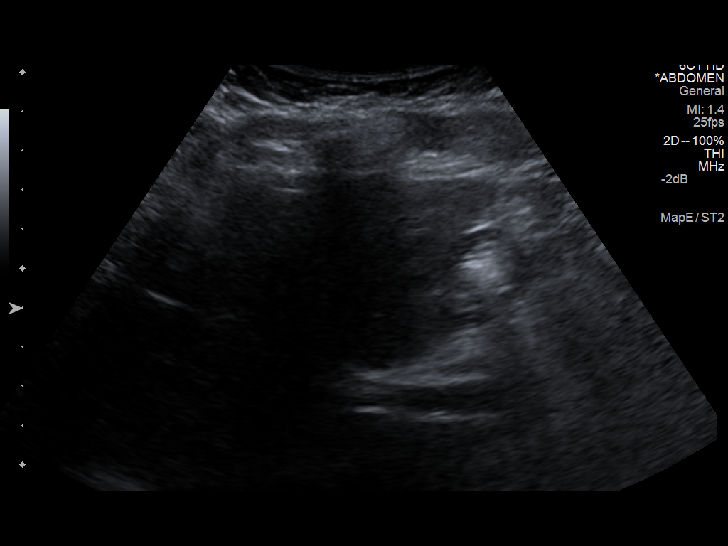
[im 53/64]
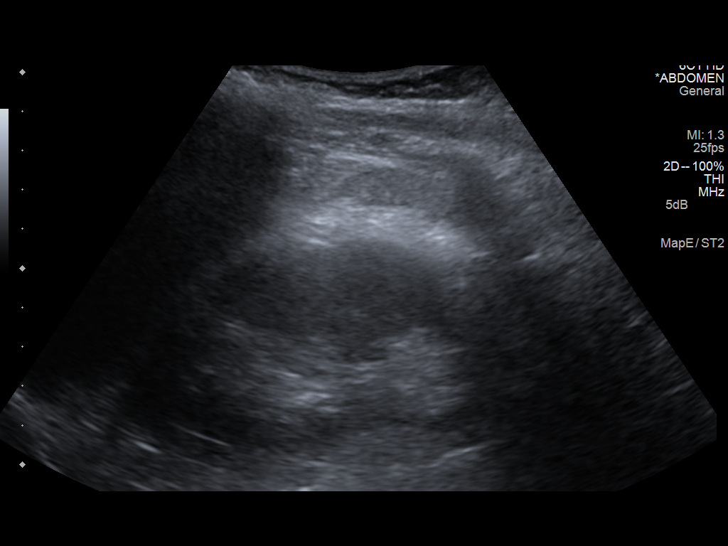
[im 58/64]
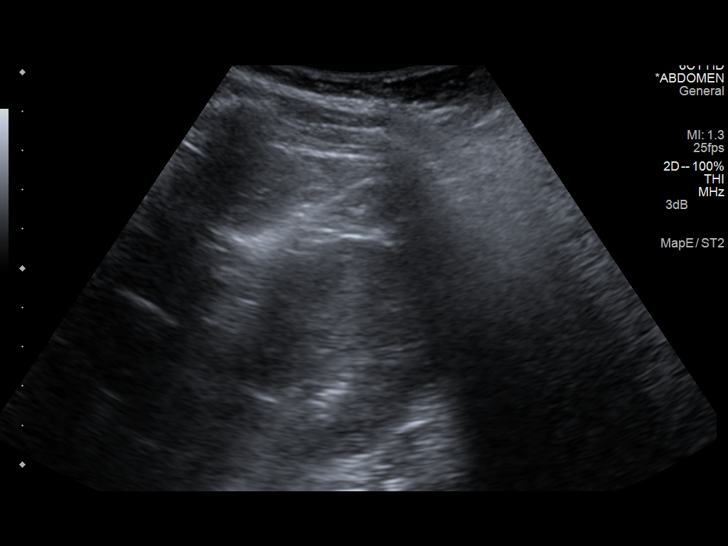
[im 64/64]
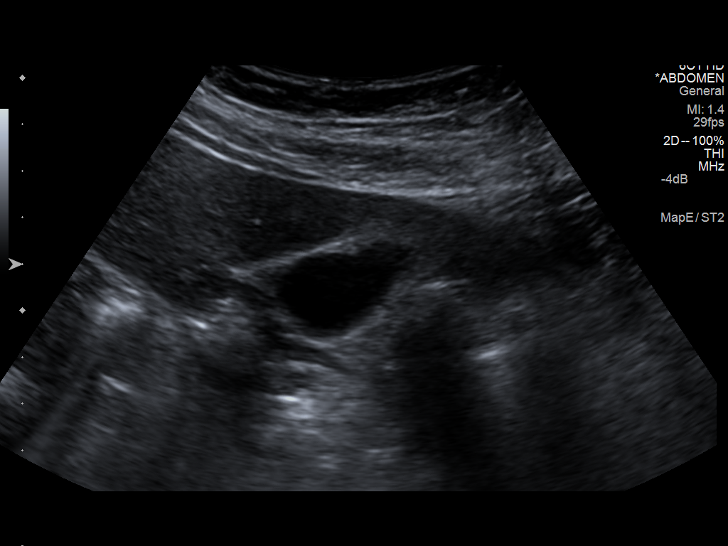

[14 of 25 positions shown; findings below may reference images not displayed]

FINDINGS: Gallbladder:

No gallstones or wall thickening visualized. There is no
pericholecystic fluid. No sonographic Murphy sign noted.

Common bile duct:

Diameter: 3 mm. There is no intrahepatic, common hepatic, or common
bile duct dilatation.

Liver:

No focal lesion identified. Within normal limits in parenchymal
echogenicity.

IVC:

No abnormality visualized.

Pancreas:

No appreciable mass or inflammatory focus.

Spleen:

Size and appearance within normal limits.

Right Kidney:

Length: 10.4 cm. Echogenicity within normal limits. No mass or
hydronephrosis visualized.

Left Kidney:

Length: 10.3 cm. Echogenicity within normal limits. No mass or
hydronephrosis visualized.

Abdominal aorta:

No aneurysm visualized.

Other findings:

No demonstrable ascites.
IMPRESSION: Study within normal limits.

## 2014-09-13 ENCOUNTER — Inpatient Hospital Stay (HOSPITAL_COMMUNITY)
Admission: EM | Admit: 2014-09-13 | Discharge: 2014-09-20 | DRG: 493 | Disposition: A | Discharge status: Home / Self Care | Payer: Medicaid Other | Attending: General Surgery | Admitting: General Surgery

## 2014-09-13 DIAGNOSIS — S060X9A Concussion with loss of consciousness of unspecified duration, initial encounter: Secondary | ICD-10-CM | POA: Diagnosis present

## 2014-09-13 DIAGNOSIS — S31801A Laceration without foreign body of unspecified buttock, initial encounter: Secondary | ICD-10-CM | POA: Diagnosis present

## 2014-09-13 DIAGNOSIS — S37039A Laceration of unspecified kidney, unspecified degree, initial encounter: Secondary | ICD-10-CM | POA: Diagnosis present

## 2014-09-13 DIAGNOSIS — S60511A Abrasion of right hand, initial encounter: Secondary | ICD-10-CM | POA: Diagnosis present

## 2014-09-13 DIAGNOSIS — S82891A Other fracture of right lower leg, initial encounter for closed fracture: Secondary | ICD-10-CM | POA: Diagnosis present

## 2014-09-13 DIAGNOSIS — F17210 Nicotine dependence, cigarettes, uncomplicated: Secondary | ICD-10-CM | POA: Diagnosis present

## 2014-09-13 DIAGNOSIS — S31821A Laceration without foreign body of left buttock, initial encounter: Secondary | ICD-10-CM | POA: Diagnosis present

## 2014-09-13 DIAGNOSIS — Q8500 Neurofibromatosis, unspecified: Secondary | ICD-10-CM

## 2014-09-13 DIAGNOSIS — S300XXA Contusion of lower back and pelvis, initial encounter: Secondary | ICD-10-CM

## 2014-09-13 DIAGNOSIS — R079 Chest pain, unspecified: Secondary | ICD-10-CM | POA: Diagnosis present

## 2014-09-13 DIAGNOSIS — T07XXXA Unspecified multiple injuries, initial encounter: Secondary | ICD-10-CM

## 2014-09-13 DIAGNOSIS — S060XAA Concussion with loss of consciousness status unknown, initial encounter: Secondary | ICD-10-CM | POA: Diagnosis present

## 2014-09-13 DIAGNOSIS — S8261XA Displaced fracture of lateral malleolus of right fibula, initial encounter for closed fracture: Principal | ICD-10-CM | POA: Diagnosis present

## 2014-09-13 DIAGNOSIS — S060X0A Concussion without loss of consciousness, initial encounter: Secondary | ICD-10-CM | POA: Diagnosis present

## 2014-09-13 DIAGNOSIS — IMO0002 Reserved for concepts with insufficient information to code with codable children: Secondary | ICD-10-CM

## 2014-09-13 DIAGNOSIS — S60512A Abrasion of left hand, initial encounter: Secondary | ICD-10-CM | POA: Diagnosis present

## 2014-09-13 DIAGNOSIS — M542 Cervicalgia: Secondary | ICD-10-CM | POA: Diagnosis present

## 2014-09-13 DIAGNOSIS — D62 Acute posthemorrhagic anemia: Secondary | ICD-10-CM | POA: Diagnosis not present

## 2014-09-13 DIAGNOSIS — S37031A Laceration of right kidney, unspecified degree, initial encounter: Secondary | ICD-10-CM | POA: Diagnosis present

## 2014-09-13 HISTORY — DX: Neurofibromatosis, unspecified: Q85.00

## 2014-09-14 ENCOUNTER — Emergency Department (HOSPITAL_COMMUNITY): Payer: Medicaid Other

## 2014-09-14 ENCOUNTER — Encounter (HOSPITAL_COMMUNITY): Payer: Self-pay | Admitting: Emergency Medicine

## 2014-09-14 ENCOUNTER — Inpatient Hospital Stay (HOSPITAL_COMMUNITY): Payer: Medicaid Other

## 2014-09-14 DIAGNOSIS — M542 Cervicalgia: Secondary | ICD-10-CM | POA: Diagnosis present

## 2014-09-14 DIAGNOSIS — R079 Chest pain, unspecified: Secondary | ICD-10-CM | POA: Diagnosis present

## 2014-09-14 DIAGNOSIS — D62 Acute posthemorrhagic anemia: Secondary | ICD-10-CM | POA: Diagnosis not present

## 2014-09-14 DIAGNOSIS — S37031A Laceration of right kidney, unspecified degree, initial encounter: Secondary | ICD-10-CM | POA: Diagnosis present

## 2014-09-14 DIAGNOSIS — Q8500 Neurofibromatosis, unspecified: Secondary | ICD-10-CM | POA: Diagnosis not present

## 2014-09-14 DIAGNOSIS — F17210 Nicotine dependence, cigarettes, uncomplicated: Secondary | ICD-10-CM | POA: Diagnosis present

## 2014-09-14 DIAGNOSIS — S060X0A Concussion without loss of consciousness, initial encounter: Secondary | ICD-10-CM | POA: Diagnosis present

## 2014-09-14 DIAGNOSIS — S31821A Laceration without foreign body of left buttock, initial encounter: Secondary | ICD-10-CM | POA: Diagnosis present

## 2014-09-14 DIAGNOSIS — S37039A Laceration of unspecified kidney, unspecified degree, initial encounter: Secondary | ICD-10-CM | POA: Diagnosis present

## 2014-09-14 DIAGNOSIS — S8261XA Displaced fracture of lateral malleolus of right fibula, initial encounter for closed fracture: Secondary | ICD-10-CM | POA: Diagnosis present

## 2014-09-14 DIAGNOSIS — S60511A Abrasion of right hand, initial encounter: Secondary | ICD-10-CM | POA: Diagnosis present

## 2014-09-14 DIAGNOSIS — S60512A Abrasion of left hand, initial encounter: Secondary | ICD-10-CM | POA: Diagnosis present

## 2014-09-14 HISTORY — DX: Laceration of unspecified kidney, unspecified degree, initial encounter: S37.039A

## 2014-09-14 LAB — COMPREHENSIVE METABOLIC PANEL
ALBUMIN: 3.7 g/dL (ref 3.5–5.2)
ALK PHOS: 58 U/L (ref 39–117)
ALT: 33 U/L (ref 0–35)
ALT: 37 U/L — AB (ref 0–35)
AST: 55 U/L — ABNORMAL HIGH (ref 0–37)
AST: 58 U/L — AB (ref 0–37)
Albumin: 3.5 g/dL (ref 3.5–5.2)
Alkaline Phosphatase: 66 U/L (ref 39–117)
Anion gap: 16 — ABNORMAL HIGH (ref 5–15)
Anion gap: 18 — ABNORMAL HIGH (ref 5–15)
BUN: 13 mg/dL (ref 6–23)
BUN: 13 mg/dL (ref 6–23)
CALCIUM: 8.6 mg/dL (ref 8.4–10.5)
CALCIUM: 8.9 mg/dL (ref 8.4–10.5)
CO2: 19 mEq/L (ref 19–32)
CO2: 16 meq/L — AB (ref 19–32)
CREATININE: 0.94 mg/dL (ref 0.50–1.10)
Chloride: 104 mEq/L (ref 96–112)
Chloride: 106 mEq/L (ref 96–112)
Creatinine, Ser: 0.69 mg/dL (ref 0.50–1.10)
GFR calc Af Amer: >90 mL/min (ref >90)
GFR calc non Af Amer: 82 mL/min — ABNORMAL LOW (ref >90)
GLUCOSE: 112 mg/dL — AB (ref 70–99)
Glucose, Bld: 109 mg/dL — ABNORMAL HIGH (ref 70–99)
Potassium: 3.8 mEq/L (ref 3.7–5.3)
Potassium: 4.1 mEq/L (ref 3.7–5.3)
SODIUM: 140 meq/L (ref 137–147)
Sodium: 139 mEq/L (ref 137–147)
Total Bilirubin: <0.2 mg/dL — ABNORMAL LOW (ref 0.3–1.2)
Total Bilirubin: 0.3 mg/dL (ref 0.3–1.2)
Total Protein: 7.6 g/dL (ref 6.0–8.3)
Total Protein: 6.6 g/dL (ref 6.0–8.3)

## 2014-09-14 LAB — SAMPLE TO BLOOD BANK

## 2014-09-14 LAB — CBC
HCT: 33 % — ABNORMAL LOW (ref 36.0–46.0)
HEMATOCRIT: 37.8 % (ref 36.0–46.0)
HEMOGLOBIN: 11 g/dL — AB (ref 12.0–15.0)
Hemoglobin: 12.4 g/dL (ref 12.0–15.0)
MCH: 25.7 pg — ABNORMAL LOW (ref 26.0–34.0)
MCH: 26.2 pg (ref 26.0–34.0)
MCHC: 32.8 g/dL (ref 30.0–36.0)
MCHC: 33.3 g/dL (ref 30.0–36.0)
MCV: 77.1 fL — ABNORMAL LOW (ref 78.0–100.0)
MCV: 79.9 fL (ref 78.0–100.0)
Platelets: 131 10*3/uL — ABNORMAL LOW (ref 150–400)
Platelets: 337 10*3/uL (ref 150–400)
RBC: 4.28 MIL/uL (ref 3.87–5.11)
RBC: 4.73 MIL/uL (ref 3.87–5.11)
RDW: 17.4 % — ABNORMAL HIGH (ref 11.5–15.5)
RDW: 17.6 % — ABNORMAL HIGH (ref 11.5–15.5)
WBC: 22.2 10*3/uL — ABNORMAL HIGH (ref 4.0–10.5)
WBC: 9 10*3/uL (ref 4.0–10.5)

## 2014-09-14 LAB — URINALYSIS, ROUTINE W REFLEX MICROSCOPIC
BILIRUBIN URINE: NEGATIVE
Glucose, UA: NEGATIVE mg/dL
Ketones, ur: 15 mg/dL — AB
LEUKOCYTES UA: NEGATIVE
Nitrite: NEGATIVE
PROTEIN: 100 mg/dL — AB
Specific Gravity, Urine: 1.023 (ref 1.005–1.030)
Urobilinogen, UA: 1 mg/dL (ref 0.0–1.0)
pH: 5.5 (ref 5.0–8.0)

## 2014-09-14 LAB — URINE MICROSCOPIC-ADD ON

## 2014-09-14 LAB — PROTIME-INR
INR: 1.05 (ref 0.00–1.49)
PROTHROMBIN TIME: 13.7 s (ref 11.6–15.2)

## 2014-09-14 LAB — PREGNANCY, URINE: PREG TEST UR: NEGATIVE

## 2014-09-14 LAB — CDS SEROLOGY

## 2014-09-14 LAB — MRSA PCR SCREENING: MRSA by PCR: NEGATIVE

## 2014-09-14 LAB — ETHANOL: Alcohol, Ethyl (B): <11 mg/dL (ref 0–11)

## 2014-09-14 LAB — I-STAT CG4 LACTIC ACID, ED: Lactic Acid, Venous: 2.4 mmol/L — ABNORMAL HIGH (ref 0.5–2.2)

## 2014-09-14 MED ORDER — HYDROMORPHONE HCL 1 MG/ML IJ SOLN
INTRAMUSCULAR | Status: AC
  Filled 2014-09-14: qty 1

## 2014-09-14 MED ORDER — MORPHINE SULFATE 4 MG/ML IJ SOLN
4.0000 mg | Freq: Once | INTRAMUSCULAR | Status: AC
  Administered 2014-09-14: 4 mg via INTRAVENOUS
  Filled 2014-09-14: qty 1

## 2014-09-14 MED ORDER — TETANUS-DIPHTHERIA TOXOIDS TD 5-2 LFU IM INJ
0.5000 mL | INJECTION | Freq: Once | INTRAMUSCULAR | Status: AC
  Administered 2014-09-14: 0.5 mL via INTRAMUSCULAR
  Filled 2014-09-14: qty 0.5

## 2014-09-14 MED ORDER — LIDOCAINE HCL (PF) 1 % IJ SOLN
5.0000 mL | Freq: Once | INTRAMUSCULAR | Status: AC
Start: 2014-09-14 — End: 2014-09-14
  Administered 2014-09-14: 5 mL
  Filled 2014-09-14: qty 5

## 2014-09-14 MED ORDER — PANTOPRAZOLE SODIUM 40 MG PO TBEC
40.0000 mg | DELAYED_RELEASE_TABLET | Freq: Every day | ORAL | Status: DC
  Administered 2014-09-14: 40 mg via ORAL
  Filled 2014-09-14: qty 1

## 2014-09-14 MED ORDER — SODIUM CHLORIDE 0.9 % IV BOLUS (SEPSIS)
125.0000 mL | Freq: Once | INTRAVENOUS | Status: AC
  Administered 2014-09-14: 125 mL via INTRAVENOUS

## 2014-09-14 MED ORDER — MORPHINE SULFATE 4 MG/ML IJ SOLN
4.0000 mg | INTRAMUSCULAR | Status: DC | PRN
  Administered 2014-09-14 (×6): 4 mg via INTRAVENOUS
  Filled 2014-09-14 (×7): qty 1

## 2014-09-14 MED ORDER — INFLUENZA VAC SPLIT QUAD 0.5 ML IM SUSY
0.5000 mL | PREFILLED_SYRINGE | INTRAMUSCULAR | Status: AC
  Administered 2014-09-15: 0.5 mL via INTRAMUSCULAR
  Filled 2014-09-14: qty 0.5

## 2014-09-14 MED ORDER — ONDANSETRON HCL 4 MG PO TABS: 4.0000 mg | ORAL_TABLET | Freq: Four times a day (QID) | ORAL | Status: DC | PRN

## 2014-09-14 MED ORDER — PANTOPRAZOLE SODIUM 40 MG IV SOLR
40.0000 mg | Freq: Every day | INTRAVENOUS | Status: DC
  Filled 2014-09-14 (×2): qty 40

## 2014-09-14 MED ORDER — DOCUSATE SODIUM 100 MG PO CAPS
100.0000 mg | ORAL_CAPSULE | Freq: Two times a day (BID) | ORAL | Status: DC | PRN
  Filled 2014-09-14: qty 1

## 2014-09-14 MED ORDER — IOHEXOL 300 MG/ML  SOLN
100.0000 mL | Freq: Once | INTRAMUSCULAR | Status: AC | PRN
  Administered 2014-09-14: 100 mL via INTRAVENOUS

## 2014-09-14 MED ORDER — KCL IN DEXTROSE-NACL 20-5-0.9 MEQ/L-%-% IV SOLN
INTRAVENOUS | Status: DC
  Administered 2014-09-14: 05:00:00 via INTRAVENOUS
  Filled 2014-09-14 (×4): qty 1000

## 2014-09-14 MED ORDER — ONDANSETRON HCL 4 MG/2ML IJ SOLN
4.0000 mg | Freq: Four times a day (QID) | INTRAMUSCULAR | Status: DC | PRN
  Administered 2014-09-14: 4 mg via INTRAVENOUS
  Filled 2014-09-14: qty 2

## 2014-09-14 MED ORDER — HYDROMORPHONE HCL 1 MG/ML IJ SOLN
1.0000 mg | Freq: Once | INTRAMUSCULAR | Status: AC
  Administered 2014-09-14: 1 mg via INTRAVENOUS

## 2014-09-14 MED ORDER — MIDAZOLAM HCL 2 MG/2ML IJ SOLN
2.0000 mg | Freq: Once | INTRAMUSCULAR | Status: AC
  Administered 2014-09-14: 2 mg via INTRAVENOUS

## 2014-09-14 MED ORDER — OXYCODONE-ACETAMINOPHEN 5-325 MG PO TABS
1.0000 | ORAL_TABLET | ORAL | Status: DC | PRN
Start: 2014-09-14 — End: 2014-09-15
  Administered 2014-09-14 (×2): 2 via ORAL
  Filled 2014-09-14 (×2): qty 2

## 2014-09-14 MED ORDER — BACITRACIN ZINC 500 UNIT/GM EX OINT
TOPICAL_OINTMENT | Freq: Two times a day (BID) | CUTANEOUS | Status: DC
  Administered 2014-09-14 – 2014-09-15 (×3): via TOPICAL
  Administered 2014-09-16 – 2014-09-20 (×10): 15.5556 via TOPICAL
  Filled 2014-09-14: qty 15
  Filled 2014-09-14 (×2): qty 28.35

## 2014-09-14 MED ORDER — MIDAZOLAM HCL 2 MG/2ML IJ SOLN
INTRAMUSCULAR | Status: AC
Start: 2014-09-14 — End: 2014-09-14
  Filled 2014-09-14: qty 2

## 2014-09-14 MED ORDER — MORPHINE SULFATE 4 MG/ML IJ SOLN
4.0000 mg | Freq: Once | INTRAMUSCULAR | Status: AC
  Administered 2014-09-14: 4 mg via INTRAVENOUS
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
## 2014-09-14 NOTE — ED Notes (Signed)
In case of emergency please call pt's Beather Arbourunt Lydia: [phone]. Will return in the morning to see pt.

## 2014-09-14 NOTE — ED Notes (Signed)
Dr. Carolynne Edouardoth as BS (Trauma)

## 2014-09-14 NOTE — ED Notes (Signed)
Pt's c-collar removed by EDP.

## 2014-09-14 NOTE — ED Notes (Signed)
Patient transported to CT 
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
## 2014-09-14 NOTE — Progress Notes (Addendum)
Patient ID: Karen Mckinney, female   DOB: 07/06/1986, 28 y.o.   MRN: [id number]   LOS: 1 day   Subjective: Pt c/o pain in her neck and some in her back.  Hasn't voided in almost 8 hours.  Doesn't feel a need to void.  Some pain in her chest wall.  C/o pain at her right ankle.  Thirsty     Objective: Vital signs in last 24 hours: Temp:  [98.1 F (36.7 C)-98.6 F (37 C)] 98.1 F (36.7 C) (09/29 0800) Pulse Rate:  [79-115] 102 (09/29 0905) Resp:  [10-24] 16 (09/29 0905) BP: (100-140)/(57-84) 111/67 mmHg (09/29 0905) SpO2:  [96 %-100 %] 96 % (09/29 0905) Weight:  [212 lb (96.163 kg)-216 lb 0.8 oz (98 kg)] 216 lb 0.8 oz (98 kg) (09/29 0445) Last BM Date: 09/13/14   Laboratory  CBC  Recent Labs  09/14/14 0004 09/14/14 0519  WBC 9.0 22.2*  HGB 12.4 11.0*  HCT 37.8 33.0*  PLT 337 131*   BMET  Recent Labs  09/14/14 0004 09/14/14 0519  NA 139 140  K 3.8 4.1  CL 104 106  CO2 19 16*  GLUCOSE 109* 112*  BUN 13 13  CREATININE 0.94 0.69  CALCIUM 8.9 8.6    Physical Exam General appearance: alert, cooperative and no distress Neck: supple, symmetrical, trachea midline and some tenderness over cervical spine.  collar initially not in place, but was replaced by RN and I Resp: clear to auscultation bilaterally Chest wall: tenderness over chest wall, but no bruising or lacs noted Cardio: regular rate and rhythm, S1, S2 normal, no murmur, click, rub or gallop GI: soft, non-tender; bowel sounds normal; no masses,  no organomegaly Extremities: RLE with cast in place.  NVI Skin: laceration on left buttock with sutures in place.  small abrasions to her knuckles on her right and left hands   Assessment/Plan: PHBC Right renal lac - follow hgb.  Voiding well with normal creatinine Left buttock lac - sutures in place Small bilateral hand abrasions - will put bacitracin ointment on these and keep them covered Right ankle fx -- Awaiting consult from Renaye Rakersim Murphy.  Will get OOB with  assistance and NWB on RLE for now ABL anemia -- Mild, follow Neck pain - she has been placed back in her C-collar.  Flex-ex are pending to clear c-spine FEN - will give clear liquids VTE - SCD on left lower extremity.  Will hold lovenox for one more day to make sure hgb is stable. Dispo - PT/OT eval.  Keep here for today    Barnetta ChapelKelly Lilie Vezina, PA-C Pager: [phone] Charma Igo(Michael Jeffery) General Trauma PA Pager: [phone]  09/14/2014

## 2014-09-14 NOTE — Progress Notes (Signed)
Awaiting flexion and extension C-spine X-rays.  This patient has been seen and I agree with the findings and treatment plan.  Marta LamasJames O. Gae BonWyatt, III, MD, FACS [phone] (pager) [phone] (direct pager) Trauma Surgeon
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
## 2014-09-14 NOTE — ED Notes (Signed)
Ortho tech paged per EDP, posterior stir-up splint for right ankle.
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
## 2014-09-14 NOTE — ED Notes (Signed)
CRITICAL VALUE ALERT  Critical value received:  Lactic acid 2.40  Date of notification:  09/14/2014  Time of notification:  0030  Critical value read back:Yes.    Nurse who received alert: mg Raygen Linquist,RN  MD notified (1st page):  Dr. Norlene Campbelltter  Time of first page:  0030  MD notified (2nd page):  Time of second page:  Responding MD:  Dr. Norlene Campbelltter  Time MD responded:  [phone]
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
## 2014-09-14 NOTE — ED Notes (Signed)
I stat lactic results given to Dr. Norlene Campbelltter by B. Bing PlumeHaynes, EMT

## 2014-09-14 NOTE — ED Notes (Signed)
[facility] at bedside[facility]

## 2014-09-15 ENCOUNTER — Inpatient Hospital Stay (HOSPITAL_COMMUNITY): Payer: Medicaid Other

## 2014-09-15 DIAGNOSIS — S31801A Laceration without foreign body of unspecified buttock, initial encounter: Secondary | ICD-10-CM | POA: Diagnosis present

## 2014-09-15 DIAGNOSIS — S82891A Other fracture of right lower leg, initial encounter for closed fracture: Secondary | ICD-10-CM

## 2014-09-15 DIAGNOSIS — T07XXXA Unspecified multiple injuries, initial encounter: Secondary | ICD-10-CM

## 2014-09-15 DIAGNOSIS — D62 Acute posthemorrhagic anemia: Secondary | ICD-10-CM | POA: Diagnosis not present

## 2014-09-15 HISTORY — DX: Other fracture of right lower leg, initial encounter for closed fracture: S82.891A

## 2014-09-15 LAB — BASIC METABOLIC PANEL
ANION GAP: 11 (ref 5–15)
BUN: 8 mg/dL (ref 6–23)
CHLORIDE: 106 meq/L (ref 96–112)
CO2: 21 meq/L (ref 19–32)
CREATININE: 0.62 mg/dL (ref 0.50–1.10)
Calcium: 8.1 mg/dL — ABNORMAL LOW (ref 8.4–10.5)
GFR calc Af Amer: >90 mL/min (ref >90)
GFR calc non Af Amer: >90 mL/min (ref >90)
Glucose, Bld: 113 mg/dL — ABNORMAL HIGH (ref 70–99)
Potassium: 3.9 mEq/L (ref 3.7–5.3)
Sodium: 138 mEq/L (ref 137–147)

## 2014-09-15 LAB — CBC
HEMATOCRIT: 29.2 % — AB (ref 36.0–46.0)
Hemoglobin: 9.4 g/dL — ABNORMAL LOW (ref 12.0–15.0)
MCH: 25.9 pg — ABNORMAL LOW (ref 26.0–34.0)
MCHC: 32.2 g/dL (ref 30.0–36.0)
MCV: 80.4 fL (ref 78.0–100.0)
Platelets: 233 10*3/uL (ref 150–400)
RBC: 3.63 MIL/uL — ABNORMAL LOW (ref 3.87–5.11)
RDW: 17.8 % — ABNORMAL HIGH (ref 11.5–15.5)
WBC: 7.4 10*3/uL (ref 4.0–10.5)

## 2014-09-15 MED ORDER — POLYETHYLENE GLYCOL 3350 17 G PO PACK: 17.0000 g | PACK | Freq: Every day | ORAL | Status: DC

## 2014-09-15 MED ORDER — POLYETHYLENE GLYCOL 3350 17 G PO PACK
17.0000 g | PACK | Freq: Every day | ORAL | Status: DC
  Administered 2014-09-15 – 2014-09-19 (×4): 17 g via ORAL
  Filled 2014-09-15 (×6): qty 1

## 2014-09-15 MED ORDER — ACETAMINOPHEN 500 MG PO TABS: 1000.0000 mg | ORAL_TABLET | Freq: Once | ORAL | Status: DC

## 2014-09-15 MED ORDER — DEXTROSE-NACL 5-0.45 % IV SOLN
100.0000 mL/h | INTRAVENOUS | Status: DC
  Administered 2014-09-15: 100 mL/h via INTRAVENOUS

## 2014-09-15 MED ORDER — ACETAMINOPHEN 500 MG PO TABS
1000.0000 mg | ORAL_TABLET | Freq: Once | ORAL | Status: AC
  Administered 2014-09-16: 1000 mg via ORAL
  Filled 2014-09-15: qty 2

## 2014-09-15 MED ORDER — MORPHINE SULFATE 2 MG/ML IJ SOLN
2.0000 mg | INTRAMUSCULAR | Status: DC | PRN
  Administered 2014-09-16 – 2014-09-19 (×5): 2 mg via INTRAVENOUS
  Filled 2014-09-15 (×5): qty 1

## 2014-09-15 MED ORDER — OXYCODONE HCL 5 MG PO TABS
5.0000 mg | ORAL_TABLET | ORAL | Status: DC | PRN
  Administered 2014-09-15 (×2): 15 mg via ORAL
  Administered 2014-09-15 – 2014-09-17 (×2): 10 mg via ORAL
  Administered 2014-09-17 – 2014-09-20 (×8): 15 mg via ORAL
  Filled 2014-09-15: qty 2
  Filled 2014-09-15 (×4): qty 3
  Filled 2014-09-15: qty 2
  Filled 2014-09-15 (×3): qty 3
  Filled 2014-09-15: qty 2
  Filled 2014-09-15: qty 1
  Filled 2014-09-15 (×2): qty 3

## 2014-09-15 MED ORDER — CEFAZOLIN SODIUM-DEXTROSE 2-3 GM-% IV SOLR
2.0000 g | INTRAVENOUS | Status: DC
  Filled 2014-09-15: qty 50

## 2014-09-15 MED ORDER — PANTOPRAZOLE SODIUM 40 MG PO TBEC
40.0000 mg | DELAYED_RELEASE_TABLET | Freq: Every day | ORAL | Status: DC
  Administered 2014-09-15: 40 mg via ORAL
  Filled 2014-09-15: qty 1

## 2014-09-15 MED ORDER — CEFAZOLIN SODIUM-DEXTROSE 2-3 GM-% IV SOLR
2.0000 g | INTRAVENOUS | Status: AC
  Administered 2014-09-16: 2 g via INTRAVENOUS
  Filled 2014-09-15: qty 50

## 2014-09-15 NOTE — Progress Notes (Signed)
Patient ID: Karen Mckinney, female   DOB: 11/28/1986, 28 y.o.   MRN: [id number]   LOS: 2 days   Subjective: No new c/o. Pain controlled.   Objective: Vital signs in last 24 hours: Temp:  [98.5 F (36.9 C)-99.4 F (37.4 C)] 99.4 F (37.4 C) (09/30 0800) Pulse Rate:  [78-116] 78 (09/30 0700) Resp:  [14-26] 14 (09/30 0700) BP: (66-122)/(23-77) 101/63 mmHg (09/30 0700) SpO2:  [96 %-100 %] 100 % (09/30 0700) Last BM Date: 09/14/14   Laboratory  CBC  Recent Labs  09/14/14 0519 09/15/14 0230  WBC 22.2* 7.4  HGB 11.0* 9.4*  HCT 33.0* 29.2*  PLT 131* 233   BMET  Recent Labs  09/14/14 0519 09/15/14 0230  NA 140 138  K 4.1 3.9  CL 106 106  CO2 16* 21  GLUCOSE 112* 113*  BUN 13 8  CREATININE 0.69 0.62  CALCIUM 8.6 8.1*    Physical Exam General appearance: alert and no distress Resp: clear to auscultation bilaterally Cardio: regular rate and rhythm GI: normal findings: bowel sounds normal and soft, non-tender Extremities: NVI   Assessment/Plan: PHBC  Right renal lac - follow hgb. Voiding well with normal creatinine  Left buttock lac - Local care Small bilateral hand abrasions - Local care Right ankle fx -- Plan ORIF tomorrow. Needs toe x-ray today per Dr. Greig RightMurphy's note, will order ABL anemia -- Moderate but plts up, check tomorrow FEN - Advance diet, SL IV VTE - Left SCD Dispo - PT/OT, transfer to floor    Freeman CaldronMichael J. Chamari Cutbirth, PA-C Pager: [phone] General Trauma PA Pager: [phone]  09/15/2014

## 2014-09-15 NOTE — Progress Notes (Signed)
Patient arrived to 6N30 via ICU hospital bed.  Pt transferred to standard hospital bed without any difficulty.  VSS.  Pt oriented to call bell, room, and [facility]pt [address] [place].  No complaints or concerns at this time.  Will report off to next RN for night shift.

## 2014-09-15 NOTE — Progress Notes (Signed)
Chaplain initiated visit with pt and family. Chaplain introduced herself and services of the spiritual care department. Pt seems to have a strong support system and a positive outlook of prognosis. Will follow as needed.   09/15/14 1030  Clinical Encounter Type  Visited With Patient and family together;Health care provider  Visit Type Follow-up  Referral From Chaplain  Stress Factors  Patient Stress Factors None identified  Family Stress Factors None identified  Rosaleigh Brazzel, Mayer MaskerCourtney F, Chaplain 09/15/2014 1:10 PM

## 2014-09-15 NOTE — Progress Notes (Signed)
Patient appropriate for the floor.  To have surgery tomorrow.  This patient has been seen and I agree with the findings and treatment plan.  Marta LamasJames O. Gae BonWyatt, III, MD, FACS [phone] (pager) [phone] (direct pager) Trauma Surgeon

## 2014-09-16 ENCOUNTER — Inpatient Hospital Stay (HOSPITAL_COMMUNITY): Payer: Medicaid Other

## 2014-09-16 ENCOUNTER — Encounter (HOSPITAL_COMMUNITY): Payer: Medicaid Other | Admitting: Anesthesiology

## 2014-09-16 ENCOUNTER — Inpatient Hospital Stay (HOSPITAL_COMMUNITY): Payer: Medicaid Other | Admitting: Anesthesiology

## 2014-09-16 ENCOUNTER — Encounter (HOSPITAL_COMMUNITY): Payer: Self-pay | Admitting: Anesthesiology

## 2014-09-16 ENCOUNTER — Encounter (HOSPITAL_COMMUNITY): Admission: EM | Disposition: A | Discharge status: Home / Self Care | Payer: Self-pay | Source: Home / Self Care

## 2014-09-16 HISTORY — PX: ORIF ANKLE FRACTURE: SHX5408

## 2014-09-16 LAB — CBC
HCT: 29.3 % — ABNORMAL LOW (ref 36.0–46.0)
Hemoglobin: 9.5 g/dL — ABNORMAL LOW (ref 12.0–15.0)
MCH: 26.2 pg (ref 26.0–34.0)
MCHC: 32.4 g/dL (ref 30.0–36.0)
MCV: 80.7 fL (ref 78.0–100.0)
PLATELETS: 225 10*3/uL (ref 150–400)
RBC: 3.63 MIL/uL — ABNORMAL LOW (ref 3.87–5.11)
RDW: 17.5 % — AB (ref 11.5–15.5)
WBC: 7.1 10*3/uL (ref 4.0–10.5)

## 2014-09-16 SURGERY — OPEN REDUCTION INTERNAL FIXATION (ORIF) ANKLE FRACTURE
Anesthesia: General | Site: Ankle | Laterality: Right

## 2014-09-16 MED ORDER — MIDAZOLAM HCL 2 MG/2ML IJ SOLN
INTRAMUSCULAR | Status: AC
  Filled 2014-09-16: qty 2

## 2014-09-16 MED ORDER — PROPOFOL 10 MG/ML IV BOLUS
INTRAVENOUS | Status: AC
  Filled 2014-09-16: qty 20

## 2014-09-16 MED ORDER — FENTANYL CITRATE 0.05 MG/ML IJ SOLN
INTRAMUSCULAR | Status: DC | PRN
  Administered 2014-09-16: 50 ug via INTRAVENOUS
  Administered 2014-09-16: 100 ug via INTRAVENOUS
  Administered 2014-09-16 (×2): 50 ug via INTRAVENOUS

## 2014-09-16 MED ORDER — MIDAZOLAM HCL 5 MG/5ML IJ SOLN
INTRAMUSCULAR | Status: DC | PRN
  Administered 2014-09-16: 2 mg via INTRAVENOUS

## 2014-09-16 MED ORDER — ENOXAPARIN SODIUM 30 MG/0.3ML ~~LOC~~ SOLN
30.0000 mg | Freq: Two times a day (BID) | SUBCUTANEOUS | Status: DC
  Administered 2014-09-16 – 2014-09-20 (×8): 30 mg via SUBCUTANEOUS
  Filled 2014-09-16 (×11): qty 0.3

## 2014-09-16 MED ORDER — DEXAMETHASONE SODIUM PHOSPHATE 4 MG/ML IJ SOLN
INTRAMUSCULAR | Status: DC | PRN
  Administered 2014-09-16: 4 mg via INTRAVENOUS

## 2014-09-16 MED ORDER — 0.9 % SODIUM CHLORIDE (POUR BTL) OPTIME
TOPICAL | Status: DC | PRN
  Administered 2014-09-16: 1000 mL

## 2014-09-16 MED ORDER — ONDANSETRON HCL 4 MG/2ML IJ SOLN: 4.0000 mg | Freq: Four times a day (QID) | INTRAMUSCULAR | Status: DC | PRN

## 2014-09-16 MED ORDER — BUPIVACAINE-EPINEPHRINE (PF) 0.5% -1:200000 IJ SOLN
INTRAMUSCULAR | Status: DC | PRN
  Administered 2014-09-16: 30 mL via PERINEURAL

## 2014-09-16 MED ORDER — LACTATED RINGERS IV SOLN
INTRAVENOUS | Status: DC | PRN
  Administered 2014-09-16: 13:00:00 via INTRAVENOUS

## 2014-09-16 MED ORDER — BUPIVACAINE HCL (PF) 0.25 % IJ SOLN
INTRAMUSCULAR | Status: AC
  Filled 2014-09-16: qty 30

## 2014-09-16 MED ORDER — GLYCOPYRROLATE 0.2 MG/ML IJ SOLN
INTRAMUSCULAR | Status: AC
  Filled 2014-09-16: qty 2

## 2014-09-16 MED ORDER — CEFAZOLIN SODIUM-DEXTROSE 2-3 GM-% IV SOLR
2.0000 g | Freq: Four times a day (QID) | INTRAVENOUS | Status: DC
  Administered 2014-09-16 – 2014-09-17 (×2): 2 g via INTRAVENOUS
  Filled 2014-09-16 (×3): qty 50

## 2014-09-16 MED ORDER — FENTANYL CITRATE 0.05 MG/ML IJ SOLN
INTRAMUSCULAR | Status: AC
  Filled 2014-09-16: qty 5

## 2014-09-16 MED ORDER — ROCURONIUM BROMIDE 50 MG/5ML IV SOLN
INTRAVENOUS | Status: AC
  Filled 2014-09-16: qty 1

## 2014-09-16 MED ORDER — LIDOCAINE HCL (CARDIAC) 20 MG/ML IV SOLN
INTRAVENOUS | Status: DC | PRN
  Administered 2014-09-16: 50 mg via INTRAVENOUS

## 2014-09-16 MED ORDER — NEOSTIGMINE METHYLSULFATE 10 MG/10ML IV SOLN
INTRAVENOUS | Status: AC
  Filled 2014-09-16: qty 1

## 2014-09-16 MED ORDER — HYDROMORPHONE HCL 1 MG/ML IJ SOLN: 0.2500 mg | INTRAMUSCULAR | Status: DC | PRN

## 2014-09-16 MED ORDER — DEXAMETHASONE SODIUM PHOSPHATE 4 MG/ML IJ SOLN
INTRAMUSCULAR | Status: AC
  Filled 2014-09-16: qty 1

## 2014-09-16 MED ORDER — OXYCODONE HCL 5 MG PO TABS: 5.0000 mg | ORAL_TABLET | Freq: Once | ORAL | Status: AC | PRN

## 2014-09-16 MED ORDER — DIPHENHYDRAMINE HCL 50 MG/ML IJ SOLN
INTRAMUSCULAR | Status: AC
  Filled 2014-09-16: qty 1

## 2014-09-16 MED ORDER — ARTIFICIAL TEARS OP OINT
TOPICAL_OINTMENT | OPHTHALMIC | Status: AC
  Filled 2014-09-16: qty 3.5

## 2014-09-16 MED ORDER — ONDANSETRON HCL 4 MG/2ML IJ SOLN
INTRAMUSCULAR | Status: AC
  Filled 2014-09-16: qty 2

## 2014-09-16 MED ORDER — PROPOFOL 10 MG/ML IV BOLUS
INTRAVENOUS | Status: DC | PRN
  Administered 2014-09-16: 200 mg via INTRAVENOUS
  Administered 2014-09-16: 100 mg via INTRAVENOUS

## 2014-09-16 MED ORDER — ONDANSETRON HCL 4 MG/2ML IJ SOLN
INTRAMUSCULAR | Status: DC | PRN
  Administered 2014-09-16: 4 mg via INTRAVENOUS

## 2014-09-16 MED ORDER — OXYCODONE HCL 5 MG/5ML PO SOLN: 5.0000 mg | Freq: Once | ORAL | Status: AC | PRN

## 2014-09-16 MED ORDER — FENTANYL CITRATE 0.05 MG/ML IJ SOLN
INTRAMUSCULAR | Status: AC
Start: 2014-09-16 — End: 2014-09-16
  Filled 2014-09-16: qty 5

## 2014-09-16 MED ORDER — LACTATED RINGERS IV SOLN
INTRAVENOUS | Status: DC
Start: 2014-09-16 — End: 2014-09-17
  Administered 2014-09-16: 14:00:00 via INTRAVENOUS

## 2014-09-16 MED ORDER — DIPHENHYDRAMINE HCL 50 MG/ML IJ SOLN
INTRAMUSCULAR | Status: DC | PRN
  Administered 2014-09-16: 10 mg via INTRAVENOUS

## 2014-09-16 MED ORDER — DEXTROSE-NACL 5-0.45 % IV SOLN
INTRAVENOUS | Status: AC
  Administered 2014-09-16 – 2014-09-17 (×2): via INTRAVENOUS

## 2014-09-16 SURGICAL SUPPLY — 73 items
BANDAGE ELASTIC 4 VELCRO ST LF (GAUZE/BANDAGES/DRESSINGS) ×6 IMPLANT
BANDAGE ESMARK 6X9 LF (GAUZE/BANDAGES/DRESSINGS) IMPLANT
BIT DRILL 2.5X125 (BIT) ×2 IMPLANT
BIT DRILL 3.5X125 (BIT) IMPLANT
BNDG CMPR 9X6 STRL LF SNTH (GAUZE/BANDAGES/DRESSINGS) ×1
BNDG COHESIVE 4X5 TAN STRL (GAUZE/BANDAGES/DRESSINGS) ×2 IMPLANT
BNDG ESMARK 6X9 LF (GAUZE/BANDAGES/DRESSINGS) ×3
CANISTER SUCTION 1500CC (MISCELLANEOUS) ×2 IMPLANT
CANISTER SUCTION 2500CC (MISCELLANEOUS) ×3 IMPLANT
CLOSURE STERI-STRIP 1/2X4 (GAUZE/BANDAGES/DRESSINGS) ×1
CLOSURE WOUND 1/2 X4 (GAUZE/BANDAGES/DRESSINGS)
CLSR STERI-STRIP ANTIMIC 1/2X4 (GAUZE/BANDAGES/DRESSINGS) ×1 IMPLANT
COVER SURGICAL LIGHT HANDLE (MISCELLANEOUS) ×3 IMPLANT
CUFF TOURNIQUET SINGLE 34IN LL (TOURNIQUET CUFF) IMPLANT
DRAPE C-ARM 42X72 X-RAY (DRAPES) IMPLANT
DRAPE C-ARMOR (DRAPES) ×1 IMPLANT
DRAPE OEC MINIVIEW 54X84 (DRAPES) ×2 IMPLANT
DRAPE ORTHO SPLIT 77X108 STRL (DRAPES) ×3
DRAPE SURG ORHT 6 SPLT 77X108 (DRAPES) IMPLANT
DRAPE U-SHAPE 47X51 STRL (DRAPES) ×2 IMPLANT
DRILL BIT 3.5X125 (BIT) ×3
DRSG PAD ABDOMINAL 8X10 ST (GAUZE/BANDAGES/DRESSINGS) ×4 IMPLANT
DURAPREP 26ML APPLICATOR (WOUND CARE) ×3 IMPLANT
ELECT REM PT RETURN 9FT ADLT (ELECTROSURGICAL) ×3
ELECTRODE REM PT RTRN 9FT ADLT (ELECTROSURGICAL) ×1 IMPLANT
GAUZE SPONGE 4X4 12PLY STRL (GAUZE/BANDAGES/DRESSINGS) ×1 IMPLANT
GLOVE BIO SURGEON STRL SZ7.5 (GLOVE) ×8 IMPLANT
GLOVE BIO SURGEON STRL SZ8 (GLOVE) ×5 IMPLANT
GLOVE BIOGEL PI IND STRL 8 (GLOVE) ×1 IMPLANT
GLOVE BIOGEL PI INDICATOR 8 (GLOVE) ×2
GLOVE BIOGEL PI ORTHO PRO SZ8 (GLOVE)
GLOVE PI ORTHO PRO STRL SZ8 (GLOVE) IMPLANT
GOWN STRL REUS W/ TWL LRG LVL3 (GOWN DISPOSABLE) ×1 IMPLANT
GOWN STRL REUS W/ TWL XL LVL3 (GOWN DISPOSABLE) ×1 IMPLANT
GOWN STRL REUS W/TWL 2XL LVL3 (GOWN DISPOSABLE) IMPLANT
GOWN STRL REUS W/TWL LRG LVL3 (GOWN DISPOSABLE) ×3
GOWN STRL REUS W/TWL XL LVL3 (GOWN DISPOSABLE) ×6
HOOD PEEL AWAY FACE SHEILD DIS (HOOD) ×2 IMPLANT
KIT BASIN OR (CUSTOM PROCEDURE TRAY) ×3 IMPLANT
KIT ROOM TURNOVER OR (KITS) ×3 IMPLANT
MANIFOLD NEPTUNE II (INSTRUMENTS) ×1 IMPLANT
NEEDLE 22X1 1/2 (OR ONLY) (NEEDLE) ×3 IMPLANT
NS IRRIG 1000ML POUR BTL (IV SOLUTION) ×3 IMPLANT
PACK ORTHO EXTREMITY (CUSTOM PROCEDURE TRAY) ×3 IMPLANT
PAD ARMBOARD 7.5X6 YLW CONV (MISCELLANEOUS) ×6 IMPLANT
PAD CAST 4YDX4 CTTN HI CHSV (CAST SUPPLIES) ×2 IMPLANT
PADDING CAST ABS 4INX4YD NS (CAST SUPPLIES) ×2
PADDING CAST ABS COTTON 4X4 ST (CAST SUPPLIES) IMPLANT
PADDING CAST COTTON 4X4 STRL (CAST SUPPLIES) ×3
PLATE TUBUAL 1/3 6H (Plate) ×2 IMPLANT
SCREW CANCELLOUS 4.0X14 (Screw) ×4 IMPLANT
SCREW CORTEX ST MATTA 3.5X12MM (Screw) ×2 IMPLANT
SCREW CORTEX ST MATTA 3.5X14 (Screw) ×6 IMPLANT
SPLINT PLASTER CAST XFAST 5X30 (CAST SUPPLIES) IMPLANT
SPLINT PLASTER XFAST SET 5X30 (CAST SUPPLIES) ×4
SPONGE GAUZE 4X4 12PLY STER LF (GAUZE/BANDAGES/DRESSINGS) ×2 IMPLANT
SPONGE LAP 18X18 X RAY DECT (DISPOSABLE) ×3 IMPLANT
STRIP CLOSURE SKIN 1/2X4 (GAUZE/BANDAGES/DRESSINGS) ×1 IMPLANT
SUCTION FRAZIER TIP 10 FR DISP (SUCTIONS) ×3 IMPLANT
SUT MNCRL AB 4-0 PS2 18 (SUTURE) ×4 IMPLANT
SUT MON AB 2-0 CT1 27 (SUTURE) ×1 IMPLANT
SUT MON AB 2-0 CT1 36 (SUTURE) ×2 IMPLANT
SUT VIC AB 0 CT1 27 (SUTURE) ×3
SUT VIC AB 0 CT1 27XBRD ANBCTR (SUTURE) IMPLANT
SYR BULB IRRIGATION 50ML (SYRINGE) ×3 IMPLANT
SYR CONTROL 10ML LL (SYRINGE) ×2 IMPLANT
TOWEL OR 17X24 6PK STRL BLUE (TOWEL DISPOSABLE) ×3 IMPLANT
TOWEL OR 17X26 10 PK STRL BLUE (TOWEL DISPOSABLE) ×3 IMPLANT
TUBE CONNECTING 12'X1/4 (SUCTIONS) ×1
TUBE CONNECTING 12X1/4 (SUCTIONS) ×2 IMPLANT
UNDERPAD 30X30 INCONTINENT (UNDERPADS AND DIAPERS) ×3 IMPLANT
WATER STERILE IRR 1000ML POUR (IV SOLUTION) ×3 IMPLANT
YANKAUER SUCT BULB TIP NO VENT (SUCTIONS) IMPLANT

## 2014-09-16 NOTE — Anesthesia Preprocedure Evaluation (Addendum)
Anesthesia Evaluation  Patient identified by MRN, date of birth, ID band Patient awake    Reviewed: Allergy & Precautions, H&P , NPO status , Patient's Chart, lab work & pertinent test results  Airway Mallampati: II TM Distance: >3 FB Neck ROM: Full  Mouth opening: Limited Mouth Opening  Dental  (+) Teeth Intact   Pulmonary Current Smoker,    Pulmonary exam normal       Cardiovascular negative cardio ROS      Neuro/Psych    GI/Hepatic   Endo/Other  obese  Renal/GU      Musculoskeletal   Abdominal Normal abdominal exam  (+)   Peds  Hematology  (+) anemia ,   Anesthesia Other Findings   Reproductive/Obstetrics                          Anesthesia Physical Anesthesia Plan  ASA: II  Anesthesia Plan: General   Post-op Pain Management:    Induction: Intravenous  Airway Management Planned: LMA  Additional Equipment:   Intra-op Plan:   Post-operative Plan: Extubation in OR  Informed Consent: I have reviewed the patients History and Physical, chart, labs and discussed the procedure including the risks, benefits and alternatives for the proposed anesthesia with the patient or authorized representative who has indicated his/her understanding and acceptance.   Dental advisory given  Plan Discussed with: CRNA, Anesthesiologist and Surgeon  Anesthesia Plan Comments:         Anesthesia Quick Evaluation
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
## 2014-09-16 NOTE — Transfer of Care (Signed)
Immediate Anesthesia Transfer of Care Note  Patient: Karen Mckinney  Procedure(s) Performed: Procedure(s): OPEN REDUCTION INTERNAL FIXATION (ORIF) ANKLE FRACTURE (Right)  Patient Location: PACU  Anesthesia Type:GA combined with regional for post-op pain  Level of Consciousness: awake, alert  and oriented  Airway & Oxygen Therapy: Patient Spontanous Breathing and Patient connected to face mask oxygen  Post-op Assessment: Report given to PACU RN  Post vital signs: Reviewed and stable  Complications: No apparent anesthesia complications

## 2014-09-16 NOTE — Anesthesia Procedure Notes (Signed)
Anesthesia Regional Block:  Popliteal block  Pre-Anesthetic Checklist: ,, timeout performed, Correct Patient, Correct Site, Correct Laterality, Correct Procedure, Correct Position, site marked, Risks and benefits discussed,  Surgical consent,  Pre-op evaluation,  At surgeon's request and post-op pain management  Laterality: Right  Prep: chloraprep       Needles:  Injection technique: Single-shot  Needle Type: Echogenic Stimulator Needle     Needle Length: 9cm 9 cm Needle Gauge: 21 and 21 G    Additional Needles:  Procedures: ultrasound guided (picture in chart) and nerve stimulator Popliteal block  Nerve Stimulator or Paresthesia:  Response: plantar flexion of foot, 0.45 mA,   Additional Responses:   Narrative:  Start time: 09/16/2014 2:01 PM End time: 09/16/2014 2:14 PM Injection made incrementally with aspirations every 5 mL.  Performed by: Personally  Anesthesiologist: Dr Chaney MallingHodierne  Additional Notes: Functioning IV was confirmed and monitors were applied.  A 90mm 21ga Arrow echogenic stimulator needle was used. Sterile prep and drape,hand hygiene and sterile gloves were used.  Negative aspiration and negative test dose prior to incremental administration of local anesthetic. The patient tolerated the procedure well.  Ultrasound guidance: relevent anatomy identified, needle position confirmed, local anesthetic spread visualized around nerve(s), vascular puncture avoided.  Image printed for medical record.

## 2014-09-16 NOTE — Progress Notes (Signed)
PT Cancellation Note  Patient Details Name: Karen Mckinney MRN: [id number] DOB: 03/02/1986   Cancelled Treatment:    Reason Eval/Treat Not Completed: Medical issues which prohibited therapy;Patient in surgery most of the work day. 09/16/2014  Talpa BingKen Arpan Eskelson, PT [phone]  (pager)   Timika Muench, Eliseo GumKenneth V 09/16/2014, 4:05 PM

## 2014-09-16 NOTE — Anesthesia Postprocedure Evaluation (Signed)
  Anesthesia Post-op Note  Patient: Karen Mckinney  Procedure(s) Performed: Procedure(s): OPEN REDUCTION INTERNAL FIXATION (ORIF) ANKLE FRACTURE (Right)  Patient Location: PACU  Anesthesia Type: General   Level of Consciousness: awake, alert  and oriented  Airway and Oxygen Therapy: Patient Spontanous Breathing  Post-op Pain: none  Post-op Assessment: Post-op Vital signs reviewed  Post-op Vital Signs: Reviewed  Last Vitals:  Filed Vitals:   09/16/14 1700  BP: 132/76  Pulse: 74  Temp:   Resp: 16    Complications: No apparent anesthesia complications

## 2014-09-16 NOTE — Op Note (Signed)
09/13/2014 - 09/16/2014  3:14 PM  PATIENT:  Karen Mckinney    PRE-OPERATIVE DIAGNOSIS:  right lateral malleolus fracture  POST-OPERATIVE DIAGNOSIS:  Same  PROCEDURE:  OPEN REDUCTION INTERNAL FIXATION (ORIF) ANKLE FRACTURE  SURGEON:  Margarita RanaMURPHY, Zaidee Rion, D, MD  ASSISTANT: Janace LittenBrandon Parry, OPA, He was necessary for efficiency and safety of the case.   ANESTHESIA:   gen  PREOPERATIVE INDICATIONS:  Karen Reelatoyia Lau is a  28 y.o. female with a diagnosis of right lateral malleolus fracture who failed conservative measures and elected for surgical management.    The risks benefits and alternatives were discussed with the patient preoperatively including but not limited to the risks of infection, bleeding, nerve injury, cardiopulmonary complications, the need for revision surgery, among others, and the patient was willing to proceed.  OPERATIVE IMPLANTS: 1/3 tubular plate  OPERATIVE FINDINGS: Unstable ankle fracture. Stable syndesmosis post op  BLOOD LOSS: min  COMPLICATIONS: none  TOURNIQUET TIME: 45  OPERATIVE PROCEDURE:  Patient was identified in the preoperative holding area and site was marked by me He was transported to the operating theater and placed on the table in supine position taking care to pad all bony prominences. After a preincinduction time out anesthesia was induced. The right lower extremity was prepped and draped in normal sterile fashion and a pre-incision timeout was performed. Karen ReelNatoyia Dinh received ancef for preoperative antibiotics.   I made a lateral incision of roughly 7 cm dissection was carried down sharply to the distal fibula and then spreading dissection was used proximally to protect the superficial peroneal nerve. I sharply incised the periosteum and took care to protect the peroneal tendons. I then debrided the fracture site and performed a reduction maneuver which was held in place with a clamp.   I placed a lag screw across the fracture  I then selected a  6-hole one third tubular plate and placed in a neutralization fashion care was taken distally so as not to penetrate the joint with the cancellus screws.    I then stressed the syndesmosis and it was stable.  The wound was then thoroughly irrigated and closed using a 0 Vicryl and absorbable Monocryl sutures. He was placed in a short leg splint.   POST OPERATIVE PLAN: Non-weightbearing. DVT prophylaxis will consist of SCD's and chemical per the primary team  This note was generated using a template and dragon dictation system. In light of that, I have reviewed the note and all aspects of it are applicable to this case. Any dictation errors are due to the computerized dictation system.

## 2014-09-16 NOTE — Progress Notes (Signed)
Patient ID: Karen Mckinney, female   DOB: 02/18/1986, 28 y.o.   MRN: [id number]   LOS: 3 days   Subjective: No c/o. Pain controlled, voiding well.   Objective: Vital signs in last 24 hours: Temp:  [98.7 F (37.1 C)-100.7 F (38.2 C)] 98.7 F (37.1 C) (10/01 0625) Pulse Rate:  [91-115] 91 (10/01 0625) Resp:  [18-21] 18 (10/01 0625) BP: (120-129)/(57-68) 120/61 mmHg (10/01 0625) SpO2:  [98 %-100 %] 100 % (10/01 0625) Weight:  [219 lb 9.6 oz (99.61 kg)] 219 lb 9.6 oz (99.61 kg) (09/30 1846) Last BM Date: 09/14/14   Laboratory  CBC  Recent Labs  09/15/14 0230 09/16/14 0543  WBC 7.4 7.1  HGB 9.4* 9.5*  HCT 29.2* 29.3*  PLT 233 225    Physical Exam General appearance: alert and no distress Resp: clear to auscultation bilaterally Cardio: regular rate and rhythm GI: normal findings: bowel sounds normal and soft, non-tender   Assessment/Plan: PHBC  Right renal lac  Left buttock lac - Local care  Small bilateral hand abrasions - Local care  Right ankle fx -- For ORIF today ABL anemia -- Stable FEN - No issues VTE - Left SCD, start Lovenox Dispo - PT/OT, OR today    Freeman CaldronMichael J. Desha Bitner, PA-C Pager: [phone] General Trauma PA Pager: [phone]  09/16/2014
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
## 2014-09-16 NOTE — Progress Notes (Signed)
Stable for surgery today.  This patient has been seen and I agree with the findings and treatment plan.  Marta LamasJames O. Gae BonWyatt, III, MD, FACS [phone] (pager) [phone] (direct pager) Trauma Surgeon

## 2014-09-16 NOTE — Progress Notes (Signed)
OT Cancellation Note  Patient Details Name: Karen Mckinney MRN: [id number] DOB: 11/25/1986   Cancelled Treatment:     Pt currently in surgery. OT will follow up with pt when appropriate. Please update activity orders as appropriate following surgery.   Nena JordanMiller, Marcin Holte M 09/16/2014, 1:07 PM   Carney LivingLeeAnn Marie Averie Meiner, OTR/L Occupational Therapist [phone] (pager)

## 2014-09-17 MED ORDER — DIPHENHYDRAMINE HCL 25 MG PO CAPS
25.0000 mg | ORAL_CAPSULE | Freq: Four times a day (QID) | ORAL | Status: DC | PRN
  Administered 2014-09-17: 25 mg via ORAL
  Filled 2014-09-17: qty 1

## 2014-09-17 NOTE — Progress Notes (Signed)
This patient has been seen and I agree with the findings and treatment plan.  Rayleigh Gillyard O. Shalika Arntz, III, MD, FACS [phone] (pager) [phone] (direct pager) Trauma Surgeon  
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
## 2014-09-17 NOTE — Progress Notes (Signed)
OT Cancellation Note  Patient Details Name: Karen Mckinney MRN: [id number] DOB: 07/20/1986   Cancelled Treatment:    Reason Eval/Treat Not Completed: Fatigue/lethargy limiting ability to participate. Pt declined OOB and participation in therapy at this time stating "I just took a Benadryl so I'm feeling sleepy." OT reinforced importance of mobilization for recovery and will reattempt tomorrow with pt.   Nena JordanMiller, Elena Davia M  [name], OTR/L Occupational Therapist [phone] (pager)  09/17/2014, 3:18 PM

## 2014-09-17 NOTE — Progress Notes (Signed)
Patient ID: Karen Mckinney, female   DOB: 09/15/1986, 28 y.o.   MRN: [id number]   LOS: 4 days   Subjective: No new c/o.   Objective: Vital signs in last 24 hours: Temp:  [98 F (36.7 C)-98.7 F (37.1 C)] 98.3 F (36.8 C) (10/02 0604) Pulse Rate:  [71-101] 71 (10/02 0604) Resp:  [15-19] 16 (10/02 0604) BP: (110-144)/(55-87) 110/55 mmHg (10/02 0604) SpO2:  [100 %] 100 % (10/02 0604) Last BM Date: 09/14/14   Laboratory  CBC  Recent Labs  09/15/14 0230 09/16/14 0543  WBC 7.4 7.1  HGB 9.4* 9.5*  HCT 29.2* 29.3*  PLT 233 225    Physical Exam General appearance: alert and no distress Resp: clear to auscultation bilaterally Cardio: regular rate and rhythm GI: normal findings: bowel sounds normal and soft, non-tender Ext: NVI   Assessment/Plan: PHBC  Right renal lac  Left buttock lac - Local care  Small bilateral hand abrasions - Local care  Right ankle fx s/p ORIF -- NWB ABL anemia -- Stable FEN - No issues VTE - Left SCD, Lovenox Dispo - PT/OT, likely home if does ok with therapies    Freeman CaldronMichael J. Laritza Vokes, PA-C Pager: [phone] General Trauma PA Pager: [phone]  09/17/2014

## 2014-09-17 NOTE — Progress Notes (Signed)
I participated in the care of this patient and agree with the above history, physical and evaluation. I performed a review of the history and a physical exam as detailed   Timothy Daniel Murphy MD  
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
## 2014-09-18 NOTE — Progress Notes (Signed)
2 Days Post-Op  Subjective: No new complaints.  Did not participate with OT yesterday.   Objective: Vital signs in last 24 hours: Temp:  [98.3 F (36.8 C)-99.2 F (37.3 C)] 98.6 F (37 C) (10/03 0546) Pulse Rate:  [92-103] 99 (10/03 0546) Resp:  [16-18] 16 (10/03 0546) BP: (117-132)/(61-74) 132/71 mmHg (10/03 0546) SpO2:  [99 %-100 %] 100 % (10/03 0546) Last BM Date: 09/14/14  Intake/Output from previous day: 10/02 0701 - 10/03 0700 In: 1050 [P.O.:1050] Out: 1915 [Urine:1915] Intake/Output this shift:     General appearance: alert and no distress  Resp: clear to auscultation bilaterally  Cardio: regular rate and rhythm  GI: normal findings: bowel sounds normal and soft, non-tender  Ext: NVI  Lab Results:   Recent Labs  09/16/14 0543  WBC 7.1  HGB 9.5*  HCT 29.3*  PLT 225   BMET No results found for this basename: NA, K, CL, CO2, GLUCOSE, BUN, CREATININE, CALCIUM,  in the last 72 hours PT/INR No results found for this basename: LABPROT, INR,  in the last 72 hours ABG No results found for this basename: PHART, PCO2, PO2, HCO3,  in the last 72 hours  Studies/Results: Dg Ankle Right Port  09/16/2014   CLINICAL DATA:  Mildly displaced closed oblique traumatic fracture of distal right fibula, subsequent for routine healing.  EXAM: PORTABLE RIGHT ANKLE - 2 VIEW  COMPARISON:  September 14, 2014.  FINDINGS: The joint has been splinted and immobilized. Internal fixation of the distal fibular fracture is noted with good alignment of fracture components.  IMPRESSION: Successful reduction and internal fixation of distal fibular fracture.   Electronically Signed   By: Roque LiasJames  Green M.D.   On: 09/16/2014 17:25    Anti-infectives: Anti-infectives   Start     Dose/Rate Route Frequency Ordered Stop   09/16/14 1745  ceFAZolin (ANCEF) IVPB 2 g/50 mL premix  Status:  Discontinued     2 g 100 mL/hr over 30 Minutes Intravenous Every 6 hours 09/16/14 1744 09/17/14 0740   09/16/14  0600  ceFAZolin (ANCEF) IVPB 2 g/50 mL premix     2 g 100 mL/hr over 30 Minutes Intravenous On call to O.R. 09/15/14 1920 09/16/14 1431   09/15/14 0600  ceFAZolin (ANCEF) IVPB 2 g/50 mL premix  Status:  Discontinued     2 g 100 mL/hr over 30 Minutes Intravenous On call to O.R. 09/15/14 0447 09/15/14 1915      Assessment/Plan:  LOS: 5 days  PHBC  Right renal lac  Left buttock lac - Local care  Small bilateral hand abrasions - Local care  Right ankle fx s/p ORIF -- NWB  ABL anemia -- Stable  FEN - No issues  VTE - Left SCD, Lovenox  Dispo - PT/OT, needs for inpatient therapy Hopefully home sunday   Brenn Gatton A. 09/18/2014
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
## 2014-09-19 MED ORDER — ACETAMINOPHEN 325 MG PO TABS
650.0000 mg | ORAL_TABLET | ORAL | Status: DC | PRN
  Administered 2014-09-19 – 2014-09-20 (×2): 650 mg via ORAL
  Filled 2014-09-19 (×2): qty 2

## 2014-09-19 NOTE — Progress Notes (Signed)
CSW consulted with patient about discharge plans.  Patient presented in hospital garb, lethargic affect, mood reported as, "okay."    Patient states that she is not interested in SNF, "I cannot afford it anyway."  Patient states that after consulting with her provider her plan is, "to stay here for a couple more days and get more therapy.  Then I will go home with home health and rehab from there."  -Patient is declining SNF -Patient is wishing to be discharged to her Aunt's residence with home health assistance.  Patient had no other complaints or concerns.  CSW let patient know that weekend CSW was on call if needed.  CSW service available as needed.  Seward SpeckLeo Estelle Greenleaf LCSW,LCAS
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
## 2014-09-19 NOTE — Progress Notes (Signed)
3 Days Post-Op  Subjective: NOT PARTICIPATING/  DOING WELL WITH PT/OT  Objective: Vital signs in last 24 hours: Temp:  [97.8 F (36.6 C)-98.9 F (37.2 C)] 98.4 F (36.9 C) (10/04 0553) Pulse Rate:  [93-104] 93 (10/04 0553) Resp:  [17-18] 17 (10/04 0553) BP: (126-131)/(74-77) 131/77 mmHg (10/04 0553) SpO2:  [99 %-100 %] 99 % (10/04 0553) Last BM Date: 09/14/14  Intake/Output from previous day: 10/03 0701 - 10/04 0700 In: 600 [P.O.:600] Out: 1025 [Urine:1025] Intake/Output this shift: Total I/O In: -  Out: 350 [Urine:350]   General appearance: alert and no distress  Resp: clear to auscultation bilaterally  Cardio: regular rate and rhythm  GI: normal findings: bowel sounds normal and soft, non-tender  Ext: NVI  Lab Results:  No results found for this basename: WBC, HGB, HCT, PLT,  in the last 72 hours BMET No results found for this basename: NA, K, CL, CO2, GLUCOSE, BUN, CREATININE, CALCIUM,  in the last 72 hours PT/INR No results found for this basename: LABPROT, INR,  in the last 72 hours ABG No results found for this basename: PHART, PCO2, PO2, HCO3,  in the last 72 hours  Studies/Results: No results found.  Anti-infectives: Anti-infectives   Start     Dose/Rate Route Frequency Ordered Stop   09/16/14 1745  ceFAZolin (ANCEF) IVPB 2 g/50 mL premix  Status:  Discontinued     2 g 100 mL/hr over 30 Minutes Intravenous Every 6 hours 09/16/14 1744 09/17/14 0740   09/16/14 0600  ceFAZolin (ANCEF) IVPB 2 g/50 mL premix     2 g 100 mL/hr over 30 Minutes Intravenous On call to O.R. 09/15/14 1920 09/16/14 1431   09/15/14 0600  ceFAZolin (ANCEF) IVPB 2 g/50 mL premix  Status:  Discontinued     2 g 100 mL/hr over 30 Minutes Intravenous On call to O.R. 09/15/14 0447 09/15/14 1915      Assessment/Plan: s/p Procedure(s): OPEN REDUCTION INTERNAL FIXATION (ORIF) ANKLE FRACTURE (Right) PHBC  Right renal lac  Left buttock lac - Local care  Small bilateral hand abrasions -  Local care  Right ankle fx s/p ORIF -- NWB  ABL anemia -- Stable  FEN - No issues  VTE - Left SCD, Lovenox  Dispo - PT/OT, DOING POORLY WITH PT/OT.  NOT FELT TO BE SAFE.  SNF / REHAB  RECOMMENDED.  NOT READY FOR DISCHARGE SINCE HELP AT HOME WILL BE INADEQUATE    LOS: 6 days    Karen Mckinney A. 09/19/2014

## 2014-09-19 NOTE — Progress Notes (Signed)
Patient complaining of severe headache pain. Dr. Dwain SarnaWakefield paged. Order for 650 mg Tylenol Q4 PRN placed for patient. Will offer and continue to monitor patient.

## 2014-09-20 ENCOUNTER — Encounter (HOSPITAL_COMMUNITY): Payer: Self-pay | Admitting: Orthopedic Surgery

## 2014-09-20 DIAGNOSIS — S060X9A Concussion with loss of consciousness of unspecified duration, initial encounter: Secondary | ICD-10-CM | POA: Diagnosis present

## 2014-09-20 DIAGNOSIS — S060XAA Concussion with loss of consciousness status unknown, initial encounter: Secondary | ICD-10-CM | POA: Diagnosis present

## 2014-09-20 MED ORDER — OXYCODONE-ACETAMINOPHEN 10-325 MG PO TABS: 1.0000 | ORAL_TABLET | ORAL | Status: DC | PRN
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
## 2014-09-20 NOTE — Plan of Care (Signed)
Problem: Acute Rehab PT Goals(only PT should resolve) Goal: Pt Will Go Up/Down Stairs Outcome: Not Met (add Reason) Pt to go home with w/c and will not require stair training as she has two strong individuals to assist her into the house via wheelchair. Handout was provided and reviewed. Pt verbalizes understanding and has no further questions.

## 2014-09-20 NOTE — Discharge Summary (Signed)
Hieu Herms, MD, MPH, FACS Trauma: [phone] General Surgery: [phone]  

## 2014-09-20 NOTE — Progress Notes (Signed)
Patient was assisted to bathroom with walker and NT. Patient stated that she lost her balance and went down on the floor NT was with patient at the time. Patient stated that she did not have any pain anywhere and there was no injury noted to patient. Patient denies hitting head. Patient was assisted off the floor with a gait and staff. Patient was able to ambulate with walker to the recliner chair. Notified Micheal, PA of patient's fall, he came to patient's bedside. Notified patient aunt Karen Mckinney of patient's fall also. VS: 98 123/71 84 18 100%ra, denies any pain.

## 2014-09-20 NOTE — Progress Notes (Signed)
Agree Angeles Paolucci, MD, MPH, FACS Trauma: [phone] General Surgery: [phone]  

## 2014-09-20 NOTE — Progress Notes (Signed)
AVS discharge instructions were given and went over with patient and her aunt. Patient was given perscription for percocet to take to her pharmacy. Patient stated that she did not have any questions. Volunteer and staff assisted patient to her transportation.

## 2014-09-20 NOTE — Care Management Note (Signed)
  Page 1 of 1   09/20/2014     9:46:28 AM CARE MANAGEMENT NOTE 09/20/2014  Patient:  Karen Mckinney,Karen Mckinney   Account Number:  [id number]  Date Initiated:  09/20/2014  Documentation initiated by:  Ronny FlurryWILE,Tekelia Kareem  Subjective/Objective Assessment:     Action/Plan:   Anticipated DC Date:  09/20/2014   Anticipated DC Plan:  HOME W HOME HEALTH SERVICES         Choice offered to / List presented to:  C-1 Patient   DME arranged  3-N-1  WALKER - ROLLING  WHEELCHAIR - MANUAL  SHOWER STOOL        HH arranged  HH-2 PT      HH agency  [facility]   Status of service:   Medicare Important Message given?   (If response is "NO", the following Medicare IM given date fields will be blank) Date Medicare IM given:   Medicare IM given by:   Date Additional Medicare IM given:   Additional Medicare IM given by:    Discharge Disposition:  HOME W HOME HEALTH SERVICES  Per UR Regulation:  Reviewed for med. necessity/level of care/duration of stay  If discussed at Long Length of Stay Meetings, dates discussed:    Comments:  09-20-14 Patient discharging to [address] , [place] , [place] , phone [phone] .  Ronny FlurryHeather Tylee Yum RN BSN

## 2014-09-20 NOTE — Progress Notes (Signed)
Patient ID: Karen Mckinney, female   DOB: 12/16/1986, 28 y.o.   MRN: [id number]   LOS: 7 days   Subjective: No new c/o. Ready to go home.   Objective: Vital signs in last 24 hours: Temp:  [98.2 F (36.8 C)-98.4 F (36.9 C)] 98.4 F (36.9 C) (10/05 0552) Pulse Rate:  [94-111] 96 (10/05 0552) Resp:  [16] 16 (10/05 0552) BP: (120-133)/(65-73) 120/65 mmHg (10/05 0552) SpO2:  [99 %-100 %] 100 % (10/05 0552) Last BM Date: 09/14/14   Physical Exam General appearance: alert and no distress Resp: clear to auscultation bilaterally Cardio: regular rate and rhythm GI: normal findings: bowel sounds normal and soft, non-tender Extremities: NVI Incision/Wound:C/D/I   Assessment/Plan: PHBC  Right renal lac  Left buttock lac - Local care  Small bilateral hand abrasions - Local care  Right ankle fx s/p ORIF -- NWB  ABL anemia -- Stable  Dispo - Home today    Freeman CaldronMichael J. Aasiya Creasey, PA-C Pager: [phone] General Trauma PA Pager: [phone]  09/20/2014

## 2014-09-20 NOTE — Progress Notes (Signed)
I participated in the care of this patient and agree with the above history, physical and evaluation. I performed a review of the history and a physical exam as detailed   Annmarie Plemmons Daniel Ellice Boultinghouse MD  
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
## 2014-09-20 NOTE — Discharge Instructions (Signed)
No weight on your right leg  Elevate as needed  No running, jumping, ball or contact sports, bikes, skateboards, motorcycles, etc for 6 weeks.  No driving while taking oxycodone.  Wash buttock wound daily in shower with soap and water. Do not soak. Apply antibiotic ointment (e.g. Neosporin) twice daily and as needed to keep moist. Cover with dry dressing.

## 2014-09-21 NOTE — Progress Notes (Signed)
I have collaborated with the above patient and changes made. 09/21/2014  Clayton BingKen Malary Aylesworth, PT [phone]  (pager)

## 2014-10-01 ENCOUNTER — Telehealth (HOSPITAL_COMMUNITY): Payer: Self-pay

## 2014-10-01 NOTE — Telephone Encounter (Signed)
Called to see about getting sutures taken out of her buttock wound. Orthopedic surgery had agreed to remove them but apparently changed her mind when she showed up in their office. Scheduled a follow-up appointment for 10/21 for suture removal.

## 2014-11-29 ENCOUNTER — Ambulatory Visit: Payer: Medicaid Other | Attending: Orthopedic Surgery

## 2014-11-29 DIAGNOSIS — R29818 Other symptoms and signs involving the nervous system: Secondary | ICD-10-CM | POA: Insufficient documentation

## 2014-11-29 DIAGNOSIS — M259 Joint disorder, unspecified: Secondary | ICD-10-CM | POA: Diagnosis not present

## 2014-11-29 DIAGNOSIS — R262 Difficulty in walking, not elsewhere classified: Secondary | ICD-10-CM | POA: Diagnosis not present

## 2014-11-29 DIAGNOSIS — M25672 Stiffness of left ankle, not elsewhere classified: Secondary | ICD-10-CM | POA: Diagnosis not present

## 2014-11-29 DIAGNOSIS — R29898 Other symptoms and signs involving the musculoskeletal system: Secondary | ICD-10-CM

## 2014-11-29 NOTE — Patient Instructions (Addendum)
Add cold to elevation at home 2x/day and to bring in HEP for review.  We also worked on pre gait weight bearing and I adjusted the crutches higher to decrese trun flexin with walking
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
## 2014-12-28 ENCOUNTER — Ambulatory Visit: Payer: Medicaid Other | Attending: Orthopedic Surgery | Admitting: Physical Therapy

## 2014-12-28 ENCOUNTER — Telehealth: Payer: Self-pay | Admitting: *Deleted

## 2014-12-28 DIAGNOSIS — M259 Joint disorder, unspecified: Secondary | ICD-10-CM | POA: Insufficient documentation

## 2014-12-28 DIAGNOSIS — M25672 Stiffness of left ankle, not elsewhere classified: Secondary | ICD-10-CM | POA: Diagnosis not present

## 2014-12-28 DIAGNOSIS — R262 Difficulty in walking, not elsewhere classified: Secondary | ICD-10-CM | POA: Insufficient documentation

## 2014-12-28 DIAGNOSIS — R29818 Other symptoms and signs involving the nervous system: Secondary | ICD-10-CM

## 2014-12-28 DIAGNOSIS — R29898 Other symptoms and signs involving the musculoskeletal system: Secondary | ICD-10-CM
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
## 2014-12-28 NOTE — Patient Instructions (Signed)
  ROM: Plantar / Dorsiflexion   With left leg relaxed, gently flex and extend ankle. Move through full range of motion. Avoid pain. Repeat __15__ times per set. Do __1__ sets per session. Do _2-3___ sessions per day.  http://orth.exer.us/34   Copyright  VHI. All rights reserved.  Ankle Alphabet   Using left ankle and foot only, trace the letters of the alphabet. Perform A to Z. Repeat __1__ times per set. Do __1__ sets per session. Do __2-3__ sessions per day.  http://orth.exer.us/16   Copyright  VHI. All rights reserved.  Ankle Circles   Slowly rotate right foot and ankle clockwise then counterclockwise. Gradually increase range of motion. Avoid pain. Circle __15__ times each direction per set. Do _1___ sets per session. Do _2-3___ sessions per day.  http://orth.exer.us/30   Copyright  VHI. All rights reserved.   Karen CraneStephanie Mckinney Karen Mckinney, PT, DPT 12/28/2014 12:11 PM  [facility] [address] [place], [place] [address]  [phone] (office) [phone] (fax)

## 2014-12-28 NOTE — Telephone Encounter (Signed)
appts made and printed...td 

## 2015-01-03 ENCOUNTER — Ambulatory Visit: Payer: Medicaid Other | Admitting: Physical Therapy

## 2015-01-03 DIAGNOSIS — R262 Difficulty in walking, not elsewhere classified: Secondary | ICD-10-CM

## 2015-01-03 DIAGNOSIS — R29898 Other symptoms and signs involving the musculoskeletal system: Secondary | ICD-10-CM

## 2015-01-03 DIAGNOSIS — M25672 Stiffness of left ankle, not elsewhere classified: Secondary | ICD-10-CM

## 2015-01-03 DIAGNOSIS — R29818 Other symptoms and signs involving the nervous system: Secondary | ICD-10-CM | POA: Diagnosis not present
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
## 2015-01-06 ENCOUNTER — Ambulatory Visit: Payer: Medicaid Other | Admitting: Physical Therapy

## 2015-01-06 DIAGNOSIS — R262 Difficulty in walking, not elsewhere classified: Secondary | ICD-10-CM

## 2015-01-06 DIAGNOSIS — R29818 Other symptoms and signs involving the nervous system: Secondary | ICD-10-CM

## 2015-01-06 DIAGNOSIS — R29898 Other symptoms and signs involving the musculoskeletal system: Secondary | ICD-10-CM

## 2015-01-06 DIAGNOSIS — M25672 Stiffness of left ankle, not elsewhere classified: Secondary | ICD-10-CM
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
## 2015-01-10 ENCOUNTER — Ambulatory Visit: Payer: Medicaid Other

## 2015-01-13 ENCOUNTER — Ambulatory Visit: Payer: Medicaid Other | Admitting: Physical Therapy

## 2015-01-13 DIAGNOSIS — R29898 Other symptoms and signs involving the musculoskeletal system: Secondary | ICD-10-CM

## 2015-01-13 DIAGNOSIS — R29818 Other symptoms and signs involving the nervous system: Secondary | ICD-10-CM

## 2015-01-13 DIAGNOSIS — R262 Difficulty in walking, not elsewhere classified: Secondary | ICD-10-CM

## 2015-01-13 DIAGNOSIS — M25672 Stiffness of left ankle, not elsewhere classified: Secondary | ICD-10-CM
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
## 2015-01-18 ENCOUNTER — Ambulatory Visit: Payer: Medicaid Other | Attending: Orthopedic Surgery | Admitting: Physical Therapy

## 2015-01-18 DIAGNOSIS — R262 Difficulty in walking, not elsewhere classified: Secondary | ICD-10-CM

## 2015-01-18 DIAGNOSIS — R29818 Other symptoms and signs involving the nervous system: Secondary | ICD-10-CM | POA: Diagnosis not present

## 2015-01-18 DIAGNOSIS — M25672 Stiffness of left ankle, not elsewhere classified: Secondary | ICD-10-CM | POA: Diagnosis not present

## 2015-01-18 DIAGNOSIS — R29898 Other symptoms and signs involving the musculoskeletal system: Secondary | ICD-10-CM

## 2015-01-18 DIAGNOSIS — M259 Joint disorder, unspecified: Secondary | ICD-10-CM | POA: Diagnosis not present
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
## 2015-01-25 ENCOUNTER — Ambulatory Visit: Payer: Medicaid Other | Admitting: Physical Therapy

## 2015-01-25 DIAGNOSIS — M25672 Stiffness of left ankle, not elsewhere classified: Secondary | ICD-10-CM

## 2015-01-25 DIAGNOSIS — R29818 Other symptoms and signs involving the nervous system: Secondary | ICD-10-CM

## 2015-01-25 DIAGNOSIS — R262 Difficulty in walking, not elsewhere classified: Secondary | ICD-10-CM

## 2015-01-25 DIAGNOSIS — R29898 Other symptoms and signs involving the musculoskeletal system: Secondary | ICD-10-CM
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
## 2015-01-26 ENCOUNTER — Ambulatory Visit: Payer: Medicaid Other | Admitting: Physical Therapy

## 2015-01-26 DIAGNOSIS — R29898 Other symptoms and signs involving the musculoskeletal system: Secondary | ICD-10-CM

## 2015-01-26 DIAGNOSIS — R29818 Other symptoms and signs involving the nervous system: Secondary | ICD-10-CM | POA: Diagnosis not present

## 2015-01-26 DIAGNOSIS — M25672 Stiffness of left ankle, not elsewhere classified: Secondary | ICD-10-CM

## 2015-01-26 DIAGNOSIS — R262 Difficulty in walking, not elsewhere classified: Secondary | ICD-10-CM
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
## 2015-02-01 ENCOUNTER — Ambulatory Visit: Payer: Medicaid Other | Admitting: Physical Therapy

## 2015-02-01 DIAGNOSIS — R262 Difficulty in walking, not elsewhere classified: Secondary | ICD-10-CM

## 2015-02-01 DIAGNOSIS — M25672 Stiffness of left ankle, not elsewhere classified: Secondary | ICD-10-CM

## 2015-02-01 DIAGNOSIS — R29898 Other symptoms and signs involving the musculoskeletal system: Secondary | ICD-10-CM

## 2015-02-01 DIAGNOSIS — R29818 Other symptoms and signs involving the nervous system: Secondary | ICD-10-CM
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
## 2015-02-03 ENCOUNTER — Ambulatory Visit: Payer: Medicaid Other | Admitting: Physical Therapy

## 2015-02-03 DIAGNOSIS — M25672 Stiffness of left ankle, not elsewhere classified: Secondary | ICD-10-CM

## 2015-02-03 DIAGNOSIS — R29898 Other symptoms and signs involving the musculoskeletal system: Secondary | ICD-10-CM

## 2015-02-03 DIAGNOSIS — R29818 Other symptoms and signs involving the nervous system: Secondary | ICD-10-CM

## 2015-02-03 DIAGNOSIS — R262 Difficulty in walking, not elsewhere classified: Secondary | ICD-10-CM

## 2015-02-03 NOTE — Patient Instructions (Signed)
Remove tape today or tomorrow
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
## 2015-02-07 ENCOUNTER — Ambulatory Visit: Payer: Medicaid Other | Admitting: Physical Therapy

## 2015-02-07 DIAGNOSIS — M25672 Stiffness of left ankle, not elsewhere classified: Secondary | ICD-10-CM

## 2015-02-07 DIAGNOSIS — R29818 Other symptoms and signs involving the nervous system: Secondary | ICD-10-CM

## 2015-02-07 DIAGNOSIS — R262 Difficulty in walking, not elsewhere classified: Secondary | ICD-10-CM

## 2015-02-07 DIAGNOSIS — R29898 Other symptoms and signs involving the musculoskeletal system: Secondary | ICD-10-CM
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
## 2015-02-08 ENCOUNTER — Ambulatory Visit: Payer: Medicaid Other | Admitting: Physical Therapy

## 2015-02-08 DIAGNOSIS — R29818 Other symptoms and signs involving the nervous system: Secondary | ICD-10-CM

## 2015-02-08 DIAGNOSIS — R262 Difficulty in walking, not elsewhere classified: Secondary | ICD-10-CM

## 2015-02-08 DIAGNOSIS — M25672 Stiffness of left ankle, not elsewhere classified: Secondary | ICD-10-CM

## 2015-02-08 DIAGNOSIS — R29898 Other symptoms and signs involving the musculoskeletal system: Secondary | ICD-10-CM
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
## 2015-02-08 NOTE — Patient Instructions (Signed)
ANKLE: Plantarflexion - Supine (Band)   Place band around foot; hold other end. Push foot down against band. Hold __1-2_ seconds. Use __blue______ band. _15__ reps per set, _2__ sets per day.  *Can sit but make sure to keep knee straight* Copyright  VHI. All rights reserved.

## 2015-02-14 ENCOUNTER — Ambulatory Visit: Payer: Medicaid Other | Admitting: Physical Therapy

## 2015-02-14 DIAGNOSIS — R29818 Other symptoms and signs involving the nervous system: Secondary | ICD-10-CM | POA: Diagnosis not present

## 2015-02-14 DIAGNOSIS — R29898 Other symptoms and signs involving the musculoskeletal system: Secondary | ICD-10-CM

## 2015-02-14 DIAGNOSIS — R262 Difficulty in walking, not elsewhere classified: Secondary | ICD-10-CM

## 2015-02-14 DIAGNOSIS — M25672 Stiffness of left ankle, not elsewhere classified: Secondary | ICD-10-CM
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
## 2015-02-16 ENCOUNTER — Encounter: Payer: Medicaid Other | Admitting: Physical Therapy

## 2015-02-16 ENCOUNTER — Ambulatory Visit: Payer: Medicaid Other | Attending: Orthopedic Surgery | Admitting: Physical Therapy

## 2015-02-16 DIAGNOSIS — M25672 Stiffness of left ankle, not elsewhere classified: Secondary | ICD-10-CM | POA: Insufficient documentation

## 2015-02-16 DIAGNOSIS — R29818 Other symptoms and signs involving the nervous system: Secondary | ICD-10-CM | POA: Insufficient documentation

## 2015-02-16 DIAGNOSIS — R262 Difficulty in walking, not elsewhere classified: Secondary | ICD-10-CM | POA: Insufficient documentation

## 2015-02-16 DIAGNOSIS — M259 Joint disorder, unspecified: Secondary | ICD-10-CM | POA: Diagnosis not present

## 2015-02-16 DIAGNOSIS — R29898 Other symptoms and signs involving the musculoskeletal system: Secondary | ICD-10-CM
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
## 2015-02-21 ENCOUNTER — Ambulatory Visit: Payer: Medicaid Other | Admitting: Physical Therapy

## 2015-02-21 DIAGNOSIS — R29898 Other symptoms and signs involving the musculoskeletal system: Secondary | ICD-10-CM

## 2015-02-21 DIAGNOSIS — R29818 Other symptoms and signs involving the nervous system: Secondary | ICD-10-CM

## 2015-02-21 DIAGNOSIS — M25672 Stiffness of left ankle, not elsewhere classified: Secondary | ICD-10-CM
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
## 2015-02-23 ENCOUNTER — Ambulatory Visit: Payer: Medicaid Other | Admitting: Physical Therapy

## 2015-02-23 DIAGNOSIS — M25672 Stiffness of left ankle, not elsewhere classified: Secondary | ICD-10-CM

## 2015-02-23 DIAGNOSIS — R29818 Other symptoms and signs involving the nervous system: Secondary | ICD-10-CM

## 2015-02-23 DIAGNOSIS — R262 Difficulty in walking, not elsewhere classified: Secondary | ICD-10-CM

## 2015-02-23 DIAGNOSIS — R29898 Other symptoms and signs involving the musculoskeletal system: Secondary | ICD-10-CM
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
## 2015-03-28 ENCOUNTER — Ambulatory Visit: Payer: Medicaid Other | Attending: Orthopedic Surgery

## 2015-03-28 DIAGNOSIS — M25561 Pain in right knee: Secondary | ICD-10-CM

## 2015-03-28 DIAGNOSIS — M259 Joint disorder, unspecified: Secondary | ICD-10-CM | POA: Insufficient documentation

## 2015-03-28 DIAGNOSIS — R262 Difficulty in walking, not elsewhere classified: Secondary | ICD-10-CM | POA: Diagnosis not present

## 2015-03-28 DIAGNOSIS — M25672 Stiffness of left ankle, not elsewhere classified: Secondary | ICD-10-CM | POA: Insufficient documentation

## 2015-03-28 DIAGNOSIS — M25562 Pain in left knee: Secondary | ICD-10-CM

## 2015-03-28 DIAGNOSIS — M25662 Stiffness of left knee, not elsewhere classified: Secondary | ICD-10-CM

## 2015-03-28 DIAGNOSIS — M25661 Stiffness of right knee, not elsewhere classified: Secondary | ICD-10-CM

## 2015-03-28 DIAGNOSIS — R29818 Other symptoms and signs involving the nervous system: Secondary | ICD-10-CM | POA: Diagnosis present
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
## 2015-03-28 NOTE — Patient Instructions (Signed)
Issued and instructed in HEP including stretching 30 sec x 2-3 reps 3x/day hip flexors a, hamstrings and LE flexion. Also strength with wall sits and minisquats and sttrength with bands. Also hip SLR for strengthening

## 2015-07-08 ENCOUNTER — Other Ambulatory Visit: Payer: Self-pay | Admitting: Nurse Practitioner

## 2015-07-08 DIAGNOSIS — N644 Mastodynia: Secondary | ICD-10-CM

## 2015-07-12 ENCOUNTER — Ambulatory Visit
Admission: RE | Admit: 2015-07-12 | Discharge: 2015-07-12 | Disposition: A | Discharge status: Home / Self Care | Payer: Medicaid Other | Source: Ambulatory Visit | Attending: Nurse Practitioner | Admitting: Nurse Practitioner

## 2015-07-12 DIAGNOSIS — N644 Mastodynia: Secondary | ICD-10-CM

## 2015-08-05 ENCOUNTER — Encounter: Payer: Self-pay | Admitting: *Deleted

## 2015-08-08 ENCOUNTER — Encounter: Payer: Self-pay | Admitting: Diagnostic Neuroimaging

## 2015-08-08 ENCOUNTER — Ambulatory Visit (INDEPENDENT_AMBULATORY_CARE_PROVIDER_SITE_OTHER): Payer: Medicaid Other | Admitting: Diagnostic Neuroimaging

## 2015-08-08 VITALS — BP 101/63 | HR 70 | Ht 66.0 in | Wt 212.4 lb

## 2015-08-08 DIAGNOSIS — T3995XA Adverse effect of unspecified nonopioid analgesic, antipyretic and antirheumatic, initial encounter: Secondary | ICD-10-CM

## 2015-08-08 DIAGNOSIS — G43109 Migraine with aura, not intractable, without status migrainosus: Secondary | ICD-10-CM

## 2015-08-08 DIAGNOSIS — G4440 Drug-induced headache, not elsewhere classified, not intractable: Secondary | ICD-10-CM

## 2015-08-08 MED ORDER — TOPIRAMATE 50 MG PO TABS
50.0000 mg | ORAL_TABLET | Freq: Two times a day (BID) | ORAL | Status: DC
Start: 2015-08-08 — End: 2015-11-18
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
## 2015-08-08 NOTE — Patient Instructions (Signed)
1. Start topiramate  at bedtime. After 1-2 weeks increase to twice a day. 2. I will check MRI brain 3. Use tylenol or ibuprofen only 5-10 doses per month     Migraine Headache A migraine headache is very bad, throbbing pain on one or both sides of your head. Talk to your doctor about what things may bring on (trigger) your migraine headaches. HOME CARE  Only take medicines as told by your doctor.  Lie down in a dark, quiet room when you have a migraine.  Keep a journal to find out if certain things bring on migraine headaches. For example, write down:  What you eat and drink.  How much sleep you get.  Any change to your diet or medicines.  Lessen how much alcohol you drink.  Quit smoking if you smoke.  Get enough sleep.  Lessen any stress in your life.  Keep lights dim if bright lights bother you or make your migraines worse. GET HELP RIGHT AWAY IF:   Your migraine becomes really bad.  You have a fever.  You have a stiff neck.  You have trouble seeing.  Your muscles are weak, or you lose muscle control.  You lose your balance or have trouble walking.  You feel like you will pass out (faint), or you pass out.  You have really bad symptoms that are different than your first symptoms. MAKE SURE YOU:   Understand these instructions.  Will watch your condition.  Will get help right away if you are not doing well or get worse. Document Released: 09/11/2008 Document Revised: 02/25/2012 Document Reviewed: 08/10/2013 [facility] Patient Information 2015 [place], [place]. This information is not intended to replace advice given to you by your health care provider. Make sure you discuss any questions you have with your health care provider.

## 2015-08-12 ENCOUNTER — Telehealth: Payer: Self-pay

## 2015-08-12 NOTE — Telephone Encounter (Signed)
Called and spoke to the subject regarding Migraine trial. She is interested in the study. Computer availability may be an issue for her. Will call her back on Monday.

## 2015-08-16 ENCOUNTER — Ambulatory Visit
Admission: RE | Admit: 2015-08-16 | Discharge: 2015-08-16 | Disposition: A | Discharge status: Home / Self Care | Payer: Medicaid Other | Source: Ambulatory Visit | Attending: Diagnostic Neuroimaging | Admitting: Diagnostic Neuroimaging

## 2015-08-16 ENCOUNTER — Telehealth: Payer: Self-pay

## 2015-08-16 ENCOUNTER — Encounter (INDEPENDENT_AMBULATORY_CARE_PROVIDER_SITE_OTHER): Payer: Medicaid Other | Admitting: Diagnostic Neuroimaging

## 2015-08-16 DIAGNOSIS — G43109 Migraine with aura, not intractable, without status migrainosus: Secondary | ICD-10-CM

## 2015-08-16 DIAGNOSIS — G4440 Drug-induced headache, not elsewhere classified, not intractable: Secondary | ICD-10-CM

## 2015-08-16 DIAGNOSIS — T3995XA Adverse effect of unspecified nonopioid analgesic, antipyretic and antirheumatic, initial encounter: Secondary | ICD-10-CM

## 2015-08-16 MED ORDER — GADOBENATE DIMEGLUMINE 529 MG/ML IV SOLN
20.0000 mL | Freq: Once | INTRAVENOUS | Status: AC | PRN
  Administered 2015-08-16: 20 mL via INTRAVENOUS

## 2015-08-16 NOTE — Telephone Encounter (Signed)
Called patient 2-3 times today to discuss Migraine trial. Left VM.  Will try later.

## 2015-08-18 ENCOUNTER — Telehealth: Payer: Self-pay

## 2015-08-18 NOTE — Telephone Encounter (Signed)
Called and left VM to return call to research. 

## 2015-09-05 ENCOUNTER — Telehealth: Payer: Self-pay | Admitting: Diagnostic Neuroimaging

## 2015-09-05 NOTE — Telephone Encounter (Signed)
Patient is calling to get the results of her cat-skan. Please call.

## 2015-09-07 NOTE — Telephone Encounter (Signed)
Spoke with patient and informed her, per Dr Marjory Lies, no acute findings in her ct scan, no findings that are cause of her headaches. Informed her he recommends she continue with migraine treatments.  She verbalized understanding, appreciation for call.

## 2015-09-07 NOTE — Telephone Encounter (Signed)
No acute findings. Continue migraine treatments. -VRP

## 2015-09-19 ENCOUNTER — Emergency Department (HOSPITAL_COMMUNITY)
Admission: EM | Admit: 2015-09-19 | Discharge: 2015-09-19 | Disposition: A | Discharge status: Home / Self Care | Payer: Medicaid Other | Attending: Emergency Medicine | Admitting: Emergency Medicine

## 2015-09-19 ENCOUNTER — Emergency Department (HOSPITAL_COMMUNITY): Payer: Medicaid Other

## 2015-09-19 ENCOUNTER — Encounter (HOSPITAL_COMMUNITY): Payer: Self-pay | Admitting: Emergency Medicine

## 2015-09-19 DIAGNOSIS — R109 Unspecified abdominal pain: Secondary | ICD-10-CM | POA: Diagnosis not present

## 2015-09-19 DIAGNOSIS — Z3A14 14 weeks gestation of pregnancy: Secondary | ICD-10-CM | POA: Insufficient documentation

## 2015-09-19 DIAGNOSIS — R110 Nausea: Secondary | ICD-10-CM | POA: Insufficient documentation

## 2015-09-19 DIAGNOSIS — O9989 Other specified diseases and conditions complicating pregnancy, childbirth and the puerperium: Secondary | ICD-10-CM | POA: Insufficient documentation

## 2015-09-19 DIAGNOSIS — O209 Hemorrhage in early pregnancy, unspecified: Secondary | ICD-10-CM | POA: Diagnosis not present

## 2015-09-19 DIAGNOSIS — O99331 Smoking (tobacco) complicating pregnancy, first trimester: Secondary | ICD-10-CM | POA: Insufficient documentation

## 2015-09-19 DIAGNOSIS — F17210 Nicotine dependence, cigarettes, uncomplicated: Secondary | ICD-10-CM | POA: Diagnosis not present

## 2015-09-19 DIAGNOSIS — O26899 Other specified pregnancy related conditions, unspecified trimester: Secondary | ICD-10-CM

## 2015-09-19 LAB — WET PREP, GENITAL
TRICH WET PREP: NONE SEEN
WBC, Wet Prep HPF POC: NONE SEEN
YEAST WET PREP: NONE SEEN

## 2015-09-19 LAB — URINALYSIS, ROUTINE W REFLEX MICROSCOPIC
Bilirubin Urine: NEGATIVE
GLUCOSE, UA: NEGATIVE mg/dL
Hgb urine dipstick: NEGATIVE
KETONES UR: NEGATIVE mg/dL
LEUKOCYTES UA: NEGATIVE
NITRITE: NEGATIVE
Protein, ur: NEGATIVE mg/dL
Specific Gravity, Urine: 1.026 (ref 1.005–1.030)
Urobilinogen, UA: 1 mg/dL (ref 0.0–1.0)
pH: 6.5 (ref 5.0–8.0)

## 2015-09-19 LAB — COMPREHENSIVE METABOLIC PANEL
ALK PHOS: 61 U/L (ref 38–126)
ALT: 11 U/L — AB (ref 14–54)
AST: 13 U/L — ABNORMAL LOW (ref 15–41)
Albumin: 3.3 g/dL — ABNORMAL LOW (ref 3.5–5.0)
Anion gap: 7 (ref 5–15)
BUN: 8 mg/dL (ref 6–20)
CALCIUM: 8.8 mg/dL — AB (ref 8.9–10.3)
CO2: 19 mmol/L — ABNORMAL LOW (ref 22–32)
CREATININE: 0.7 mg/dL (ref 0.44–1.00)
Chloride: 107 mmol/L (ref 101–111)
Glucose, Bld: 99 mg/dL (ref 65–99)
Potassium: 3.6 mmol/L (ref 3.5–5.1)
Sodium: 133 mmol/L — ABNORMAL LOW (ref 135–145)
Total Bilirubin: 0.3 mg/dL (ref 0.3–1.2)
Total Protein: 6.3 g/dL — ABNORMAL LOW (ref 6.5–8.1)

## 2015-09-19 LAB — CBC
HCT: 34.5 % — ABNORMAL LOW (ref 36.0–46.0)
Hemoglobin: 11.3 g/dL — ABNORMAL LOW (ref 12.0–15.0)
MCH: 26.8 pg (ref 26.0–34.0)
MCHC: 32.8 g/dL (ref 30.0–36.0)
MCV: 81.8 fL (ref 78.0–100.0)
Platelets: 326 10*3/uL (ref 150–400)
RBC: 4.22 MIL/uL (ref 3.87–5.11)
RDW: 16.3 % — ABNORMAL HIGH (ref 11.5–15.5)
WBC: 7.7 10*3/uL (ref 4.0–10.5)

## 2015-09-19 LAB — HCG, QUANTITATIVE, PREGNANCY: hCG, Beta Chain, Quant, S: 122172 m[IU]/mL — ABNORMAL HIGH (ref <5)

## 2015-09-19 MED ORDER — ACETAMINOPHEN 500 MG PO TABS: 500.0000 mg | ORAL_TABLET | Freq: Four times a day (QID) | ORAL | Status: DC | PRN

## 2015-09-19 NOTE — ED Notes (Signed)
Pt reports lmp 07/01/15. Pt had positive home pregnancy test and positive pregnancy test at health dept. 2-3 days ago she started having sharp lower mid abdominal pain. Denies vaginal discharge or bleeding.
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
## 2015-09-19 NOTE — ED Notes (Signed)
Patient transported to Ultrasound 

## 2015-09-19 NOTE — Discharge Instructions (Signed)
Abdominal Pain During Pregnancy °Abdominal pain is common in pregnancy. Most of the time, it does not cause harm. There are many causes of abdominal pain. Some causes are more serious than others. Some of the causes of abdominal pain in pregnancy are easily diagnosed. Occasionally, the diagnosis takes time to understand. Other times, the cause is not determined. Abdominal pain can be a sign that something is very wrong with the pregnancy, or the pain may have nothing to do with the pregnancy at all. For this reason, always tell your health care provider if you have any abdominal discomfort. °HOME CARE INSTRUCTIONS  °Monitor your abdominal pain for any changes. The following actions may help to alleviate any discomfort you are experiencing: °· Do not have sexual intercourse or put anything in your vagina until your symptoms go away completely. °· Get plenty of rest until your pain improves. °· Drink clear fluids if you feel nauseous. Avoid solid food as long as you are uncomfortable or nauseous. °· Only take over-the-counter or prescription medicine as directed by your health care provider. °· Keep all follow-up appointments with your health care provider. °SEEK IMMEDIATE MEDICAL CARE IF: °· You are bleeding, leaking fluid, or passing tissue from the vagina. °· You have increasing pain or cramping. °· You have persistent vomiting. °· You have painful or bloody urination. °· You have a fever. °· You notice a decrease in your baby's movements. °· You have extreme weakness or feel faint. °· You have shortness of breath, with or without abdominal pain. °· You develop a severe headache with abdominal pain. °· You have abnormal vaginal discharge with abdominal pain. °· You have persistent diarrhea. °· You have abdominal pain that continues even after rest, or gets worse. °MAKE SURE YOU:  °· Understand these instructions. °· Will watch your condition. °· Will get help right away if you are not doing well or get  worse. °Document Released: 12/03/2005 Document Revised: 09/23/2013 Document Reviewed: 07/02/2013 °ExitCare® Patient Information ©2015 [employer]. This information is not intended to replace advice given to you by your health care provider. Make sure you discuss any questions you have with your health care provider. °Prenatal Care  °WHAT IS PRENATAL CARE?  °Prenatal care means health care during your pregnancy, before your baby is born. It is very important to take care of yourself and your baby during your pregnancy by:  °· Getting early prenatal care. If you know you are pregnant, or think you might be pregnant, call your health care provider as soon as possible. Schedule a visit for a prenatal exam. °· Getting regular prenatal care. Follow your health care provider's schedule for blood and other necessary tests. Do not miss appointments. °· Doing everything you can to keep yourself and your baby healthy during your pregnancy. °· Getting complete care. Prenatal care should include evaluation of the medical, dietary, educational, psychological, and social needs of you and your significant other. The medical and genetic history of your family and the family of your baby's father should be discussed with your health care provider. °· Discussing with your health care provider: °¨ Prescription, over-the-counter, and herbal medicines that you take. °¨ Any history of substance abuse, alcohol use, smoking, and illegal drug use. °¨ Any history of domestic abuse and violence. °¨ Immunizations you have received. °¨ Your nutrition and diet. °¨ The amount of exercise you do. °¨ Any environmental and occupational hazards to which you are exposed. °¨ History of sexually transmitted infections for both you   and your partner. °¨ Previous pregnancies you have had. °WHY IS PRENATAL CARE SO IMPORTANT?  °By regularly seeing your health care provider, you help ensure that problems can be identified early so that they can be treated as  soon as possible. Other problems might be prevented. Many studies have shown that early and regular prenatal care is important for the health of mothers and their babies.  °HOW CAN I TAKE CARE OF MYSELF WHILE I AM PREGNANT?  °Here are ways to take care of yourself and your baby:  °· Start or continue taking your multivitamin with 400 micrograms (mcg) of folic acid every day. °· Get early and regular prenatal care. It is very important to see a health care provider during your pregnancy. Your health care provider will check at each visit to make sure that you and your baby are healthy. If there are any problems, action can be taken right away to help you and your baby. °· Eat a healthy diet that includes: °¨ Fruits. °¨ Vegetables. °¨ Foods low in saturated fat. °¨ Whole grains. °¨ Calcium-rich foods, such as milk, yogurt, and hard cheeses. °· Drink 6-8 glasses of liquids a day. °· Unless your health care provider tells you not to, try to be physically active for 30 minutes, most days of the week. If you are pressed for time, you can get your activity in through 10-minute segments, three times a day. °· Do not smoke, drink alcohol, or use drugs. These can cause long-term damage to your baby. Talk with your health care provider about steps to take to stop smoking. Talk with a member of your faith community, a counselor, a trusted friend, or your health care provider if you are concerned about your alcohol or drug use. °· Ask your health care provider before taking any medicine, even over-the-counter medicines. Some medicines are not safe to take during pregnancy. °· Get plenty of rest and sleep. °· Avoid hot tubs and saunas during pregnancy. °· Do not have X-rays taken unless absolutely necessary and with the recommendation of your health care provider. A lead shield can be placed on your abdomen to protect your baby when X-rays are taken in other parts of your body. °· Do not empty the cat litter when you are  pregnant. It may contain a parasite that causes an infection called toxoplasmosis, which can cause birth defects. Also, use gloves when working in garden areas used by cats. °· Do not eat uncooked or undercooked meats or fish. °· Do not eat soft, mold-ripened cheeses (Brie, Camembert, and chevre) or soft, blue-veined cheese (Danish blue and Roquefort). °· Stay away from toxic chemicals like: °¨ Insecticides. °¨ Solvents (some cleaners or paint thinners). °¨ Lead. °¨ Mercury. °· Sexual intercourse may continue until the end of the pregnancy, unless you have a medical problem or there is a problem with the pregnancy and your health care provider tells you not to. °· Do not wear high-heel shoes, especially during the second half of the pregnancy. You can lose your balance and fall. °· Do not take long trips, unless absolutely necessary. Be sure to see your health care provider before going on the trip. °· Do not sit in one position for more than 2 hours when on a trip. °· Take a copy of your medical records when going on a trip. Know where a hospital is located in the city you are visiting, in case of an emergency. °· Most dangerous household products will have pregnancy   warnings on their labels. Ask your health care provider about products if you are unsure. °· Limit or eliminate your caffeine intake from coffee, tea, sodas, medicines, and chocolate. °· Many women continue working through pregnancy. Staying active might help you stay healthier. If you have a question about the safety or the hours you work at your particular job, talk with your health care provider. °· Get informed: °¨ Read books. °¨ Watch videos. °¨ Go to childbirth classes for you and your significant other. °¨ Talk with experienced moms. °· Ask your health care provider about childbirth education classes for you and your partner. Classes can help you and your partner prepare for the birth of your baby. °· Ask about a baby doctor (pediatrician) and  methods and pain medicine for labor, delivery, and possible cesarean delivery. °HOW OFTEN SHOULD I SEE MY HEALTH CARE PROVIDER DURING PREGNANCY?  °Your health care provider will give you a schedule for your prenatal visits. You will have visits more often as you get closer to the end of your pregnancy. An average pregnancy lasts about 40 weeks.  °A typical schedule includes visiting your health care provider:  °· About once each month during your first 6 months of pregnancy. °· Every 2 weeks during the next 2 months. °· Weekly in the last month, until the delivery date. °Your health care provider will probably want to see you more often if: °· You are older than 35 years. °· Your pregnancy is high risk because you have certain health problems or problems with the pregnancy, such as: °¨ Diabetes. °¨ High blood pressure. °¨ The baby is not growing on schedule, according to the dates of the pregnancy. °Your health care provider will do special tests to make sure you and your baby are not having any serious problems. °WHAT HAPPENS DURING PRENATAL VISITS?  °· At your first prenatal visit, your health care provider will do a physical exam and talk to you about your health history and the health history of your partner and your family. Your health care provider will be able to tell you what date to expect your baby to be born on. °· Your first physical exam will include checks of your blood pressure, measurements of your height and weight, and an exam of your pelvic organs. Your health care provider will do a Pap test if you have not had one recently and will do cultures of your cervix to make sure there is no infection. °· At each prenatal visit, there will be tests of your blood, urine, blood pressure, weight, and the progress of the baby will be checked. °· At your later prenatal visits, your health care provider will check how you are doing and how your baby is developing. You may have a number of tests done as your  pregnancy progresses. °¨ Ultrasound exams are often used to check on your baby's growth and health. °¨ You may have more urine and blood tests, as well as special tests, if needed. These may include amniocentesis to examine fluid in the pregnancy sac, stress tests to check how the baby responds to contractions, or a biophysical profile to measure your baby's well-being. Your health care provider will explain the tests and why they are necessary. °¨ You should be tested for high blood sugar (gestational diabetes) between the 24th and 28th weeks of your pregnancy. °· You should discuss with your health care provider your plans to breastfeed or bottle-feed your baby. °· Each visit is also   a chance for you to learn about staying healthy during pregnancy and to ask questions. °Document Released: 12/06/2003 Document Revised: 12/08/2013 Document Reviewed: 02/17/2014 °ExitCare® Patient Information ©2015 [employer]. This information is not intended to replace advice given to you by your health care provider. Make sure you discuss any questions you have with your health care provider. ° °

## 2015-09-20 LAB — GC/CHLAMYDIA PROBE AMP (~~LOC~~) NOT AT ARMC
CHLAMYDIA, DNA PROBE: NEGATIVE
NEISSERIA GONORRHEA: NEGATIVE

## 2015-10-21 ENCOUNTER — Encounter: Payer: Medicaid Other | Admitting: Certified Nurse Midwife

## 2015-10-24 LAB — OB RESULTS CONSOLE PLATELET COUNT: Platelets: 319 10*3/uL

## 2015-10-24 LAB — OB RESULTS CONSOLE ANTIBODY SCREEN: ANTIBODY SCREEN: NEGATIVE

## 2015-10-24 LAB — OB RESULTS CONSOLE GC/CHLAMYDIA
CHLAMYDIA, DNA PROBE: NEGATIVE
Gonorrhea: NEGATIVE

## 2015-10-24 LAB — OB RESULTS CONSOLE ABO/RH: RH Type: POSITIVE

## 2015-10-24 LAB — CYSTIC FIBROSIS DIAGNOSTIC STUDY: Interpretation-CFDNA:: NEGATIVE

## 2015-10-24 LAB — OB RESULTS CONSOLE HGB/HCT, BLOOD
HCT: 35 %
Hemoglobin: 12 g/dL

## 2015-10-24 LAB — CULTURE, OB URINE: Urine Culture, OB: NO GROWTH

## 2015-10-24 LAB — GLUCOSE TOLERANCE, 1 HOUR (50G) W/O FASTING: Glucose, GTT - 1 Hour: 115 mg/dL (ref ?–200)

## 2015-10-25 LAB — OB RESULTS CONSOLE RPR: RPR: NONREACTIVE

## 2015-10-25 LAB — OB RESULTS CONSOLE HIV ANTIBODY (ROUTINE TESTING): HIV: NONREACTIVE

## 2015-10-25 LAB — OB RESULTS CONSOLE GC/CHLAMYDIA
CHLAMYDIA, DNA PROBE: NEGATIVE
Gonorrhea: NEGATIVE

## 2015-10-25 LAB — OB RESULTS CONSOLE RUBELLA ANTIBODY, IGM: Rubella: IMMUNE

## 2015-10-25 LAB — OB RESULTS CONSOLE HEPATITIS B SURFACE ANTIGEN: HEP B S AG: NEGATIVE

## 2015-11-03 ENCOUNTER — Ambulatory Visit (INDEPENDENT_AMBULATORY_CARE_PROVIDER_SITE_OTHER): Payer: Medicaid Other | Admitting: Obstetrics & Gynecology

## 2015-11-03 ENCOUNTER — Other Ambulatory Visit: Payer: Self-pay | Admitting: Obstetrics & Gynecology

## 2015-11-03 ENCOUNTER — Encounter: Payer: Self-pay | Admitting: Obstetrics & Gynecology

## 2015-11-03 ENCOUNTER — Encounter: Payer: Self-pay | Admitting: *Deleted

## 2015-11-03 VITALS — BP 151/72 | HR 61 | Temp 98.6°F | Wt 210.8 lb

## 2015-11-03 DIAGNOSIS — O0990 Supervision of high risk pregnancy, unspecified, unspecified trimester: Secondary | ICD-10-CM | POA: Insufficient documentation

## 2015-11-03 DIAGNOSIS — O0992 Supervision of high risk pregnancy, unspecified, second trimester: Secondary | ICD-10-CM

## 2015-11-03 DIAGNOSIS — Z113 Encounter for screening for infections with a predominantly sexual mode of transmission: Secondary | ICD-10-CM

## 2015-11-03 DIAGNOSIS — O34219 Maternal care for unspecified type scar from previous cesarean delivery: Secondary | ICD-10-CM | POA: Insufficient documentation

## 2015-11-03 DIAGNOSIS — O09292 Supervision of pregnancy with other poor reproductive or obstetric history, second trimester: Secondary | ICD-10-CM

## 2015-11-03 DIAGNOSIS — O09299 Supervision of pregnancy with other poor reproductive or obstetric history, unspecified trimester: Secondary | ICD-10-CM | POA: Insufficient documentation

## 2015-11-03 LAB — OB RESULTS CONSOLE GBS: GBS: NEGATIVE

## 2015-11-03 LAB — POCT URINALYSIS DIP (DEVICE)
BILIRUBIN URINE: NEGATIVE
GLUCOSE, UA: NEGATIVE mg/dL
Hgb urine dipstick: NEGATIVE
Nitrite: NEGATIVE
PH: 6 (ref 5.0–8.0)
Protein, ur: NEGATIVE mg/dL
Specific Gravity, Urine: >=1.03 (ref 1.005–1.030)
Urobilinogen, UA: 1 mg/dL (ref 0.0–1.0)

## 2015-11-03 MED ORDER — ASPIRIN EC 81 MG PO TBEC: 81.0000 mg | DELAYED_RELEASE_TABLET | Freq: Every day | ORAL | Status: DC

## 2015-11-03 NOTE — Progress Notes (Signed)
Hx of preeclampsia in previous pregnancy with emergency c-section performed.  Pt plans to bottle feed.

## 2015-11-03 NOTE — Patient Instructions (Signed)
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
## 2015-11-04 LAB — PRESCRIPTION MONITORING PROFILE (19 PANEL)
AMPHETAMINE/METH: NEGATIVE ng/mL
BARBITURATE SCREEN, URINE: NEGATIVE ng/mL
BENZODIAZEPINE SCREEN, URINE: NEGATIVE ng/mL
BUPRENORPHINE, URINE: NEGATIVE ng/mL
Cannabinoid Scrn, Ur: NEGATIVE ng/mL
Carisoprodol, Urine: NEGATIVE ng/mL
Cocaine Metabolites: NEGATIVE ng/mL
Creatinine, Urine: 254.62 mg/dL (ref >20.0)
ECSTASY: NEGATIVE ng/mL
Fentanyl, Ur: NEGATIVE ng/mL
METHAQUALONE SCREEN (URINE): NEGATIVE ng/mL
Meperidine, Ur: NEGATIVE ng/mL
Methadone Screen, Urine: NEGATIVE ng/mL
NITRITES URINE, INITIAL: NEGATIVE ug/mL
OPIATE SCREEN, URINE: NEGATIVE ng/mL
Oxycodone Screen, Ur: NEGATIVE ng/mL
PROPOXYPHENE: NEGATIVE ng/mL
Phencyclidine, Ur: NEGATIVE ng/mL
TAPENTADOLUR: NEGATIVE ng/mL
TRAMADOL UR: NEGATIVE ng/mL
ZOLPIDEM, URINE: NEGATIVE ng/mL
pH, Initial: 6.1 pH (ref 4.5–8.9)

## 2015-11-04 LAB — CULTURE, OB URINE
Colony Count: NO GROWTH
Organism ID, Bacteria: NO GROWTH

## 2015-11-15 ENCOUNTER — Encounter: Payer: Self-pay | Admitting: *Deleted

## 2015-11-18 ENCOUNTER — Encounter: Payer: Self-pay | Admitting: Diagnostic Neuroimaging

## 2015-11-18 ENCOUNTER — Other Ambulatory Visit: Payer: Self-pay | Admitting: Certified Nurse Midwife

## 2015-11-18 ENCOUNTER — Ambulatory Visit (INDEPENDENT_AMBULATORY_CARE_PROVIDER_SITE_OTHER): Payer: Medicaid Other | Admitting: Diagnostic Neuroimaging

## 2015-11-18 VITALS — BP 120/68 | HR 67 | Ht 66.0 in | Wt 218.6 lb

## 2015-11-18 DIAGNOSIS — G43109 Migraine with aura, not intractable, without status migrainosus: Secondary | ICD-10-CM
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
## 2015-11-18 NOTE — Patient Instructions (Signed)

## 2015-11-24 ENCOUNTER — Ambulatory Visit (INDEPENDENT_AMBULATORY_CARE_PROVIDER_SITE_OTHER): Payer: Medicaid Other | Admitting: Family

## 2015-11-24 VITALS — BP 125/82 | HR 72 | Temp 98.1°F | Wt 212.1 lb

## 2015-11-24 DIAGNOSIS — O0990 Supervision of high risk pregnancy, unspecified, unspecified trimester: Secondary | ICD-10-CM

## 2015-11-24 DIAGNOSIS — O34219 Maternal care for unspecified type scar from previous cesarean delivery: Secondary | ICD-10-CM

## 2015-11-24 DIAGNOSIS — O09292 Supervision of pregnancy with other poor reproductive or obstetric history, second trimester: Secondary | ICD-10-CM | POA: Diagnosis not present

## 2015-11-24 LAB — POCT URINALYSIS DIP (DEVICE)
GLUCOSE, UA: NEGATIVE mg/dL
Hgb urine dipstick: NEGATIVE
KETONES UR: NEGATIVE mg/dL
Leukocytes, UA: NEGATIVE
NITRITE: NEGATIVE
Protein, ur: 30 mg/dL — AB
Specific Gravity, Urine: >=1.03 (ref 1.005–1.030)
Urobilinogen, UA: 1 mg/dL (ref 0.0–1.0)
pH: 5.5 (ref 5.0–8.0)

## 2015-11-24 NOTE — Progress Notes (Signed)
Anatomy U/S 12/01/15 @ 11a.

## 2015-11-24 NOTE — Progress Notes (Signed)
Subjective:  Karen Mckinney is a 29 y.o. [id number] at 658w6d being seen today for ongoing prenatal care.  She is currently monitored for the following issues for this high-risk pregnancy and has supervision of high risk pregnancy, antepartum; Previous cesarean section complicating pregnancy; and Hx of preeclampsia, prior pregnancy, currently pregnant on her problem list.  Patient reports no complaints.  Contractions: Not present. Vag. Bleeding: None.  Movement: Present. Denies leaking of fluid.   The following portions of the patient's history were reviewed and updated as appropriate: allergies, current medications, past family history, past medical history, past social history, past surgical history and problem list. Problem list updated.  Objective:   Filed Vitals:   11/24/15 1116  BP: 125/82  Pulse: 72  Temp: 98.1 F (36.7 C)  Weight: 212 lb 1.6 oz (96.208 kg)    Fetal Status: Fetal Heart Rate (bpm): 159 Fundal Height: 23 cm Movement: Present     General:  Alert, oriented and cooperative. Patient is in no acute distress.  Skin: Skin is warm and dry. No rash noted.   Cardiovascular: Normal heart rate noted  Respiratory: Normal respiratory effort, no problems with respiration noted  Abdomen: Soft, gravid, appropriate for gestational age. Pain/Pressure: Absent     Pelvic: Vag. Bleeding: None     Cervical exam deferred        Extremities: Normal range of motion.  Edema: None  Mental Status: Normal mood and affect. Normal behavior. Normal judgment and thought content.   Urinalysis: Urine Protein: 1+ Urine Glucose: Negative  Assessment and Plan:  Pregnancy: [id number] at 308w6d  1. Hx of preeclampsia, prior pregnancy, currently pregnant, second trimester - Taking Baby ASA  2. Supervision of high risk pregnancy, antepartum, unspecified trimester - US MFM OB COMP + 14 WK; Future  3. Previous cesarean section complicating pregnancy - Given consent form to review  Preterm labor symptoms  and general obstetric precautions including but not limited to vaginal bleeding, contractions, leaking of fluid and fetal movement were reviewed in detail with the patient. Please refer to After Visit Summary for other counseling recommendations.  Return in about 3 weeks (around 12/15/2015).   Eino FarberWalidah Kennith GainN Karim, CNM

## 2015-11-24 NOTE — Patient Instructions (Signed)
AREA PEDIATRIC/FAMILY PRACTICE PHYSICIANS  [facility] [address] [place], [place] [address] Phone - [phone]   Fax - [phone]  [facility] [address] [place], [place]  [address] Phone - [phone]   Fax - [phone]  [facility] [address] [place], [place]  [address] Phone - [phone]   Fax - [phone]  [facility] [address] [place], [place]  [address] Phone - [phone]   Fax - [phone]  [facility] [address] [place], [place]  [address] Phone - [phone]   Fax - [phone]  [facility] [address] [place], [place]  [address] Phone - [phone]   Fax - [phone]  [facility] [address] [place], [place]  [address] Phone - [phone]   Fax - [phone]  [facility] [address], [address] [place], [place]  [address] Phone - [phone]   Fax - [phone]  [facility] [address] [place], [place]  [address] Phone - [phone]   Fax - [phone] [facility] [address] [place], [place]  [address] Phone - [phone]   Fax - [phone]  [facility] [address] [place], [place]  [address] Phone - [phone]   Fax - [phone]  [facility] [address] [place], [place]  [address] Phone - [phone]   Fax - [phone]  [facility] [address] [place], [place]  [address] Phone - [phone]   Fax - [phone]  [facility] [address] [place], [place]  [address] Phone - [phone]  [facility] [address] [place], [place]  [address] Phone - [phone]   Fax - [phone]  [facility] [address] [place], [place]  [address] Phone - [phone]   Fax - [phone]  [facility] [address] [place], [place]  [address] Phone - [phone]   Fax - [phone]  [facility] [address] [place], [place]  [address] Phone - [phone]   Fax - [phone]   [facility] [address] [place], [place]  [address] Phone - [phone]   Fax - [phone]  [facility] [address] [place], [place]  [address] Phone - [phone]   Fax - [phone]  [facility] [address] , [place]  [address] Phone - [phone]   Fax - [phone]  [facility] [address] , [place]  [address] Phone - [phone]   Fax - [phone]  [facility] [address] , [place]  [address] Phone - [phone]   Fax - [phone] [facility] [address] [place], [place]  [address] Phone - [phone]   Fax - [phone]  [facility] [address] [place], [place]  [address] Phone - [phone]   Fax - [phone]  [facility] [address] [place], [place]  [address] Phone - [phone]   Fax - [phone]   

## 2015-12-01 ENCOUNTER — Ambulatory Visit (HOSPITAL_COMMUNITY)
Admission: RE | Admit: 2015-12-01 | Discharge: 2015-12-01 | Disposition: A | Discharge status: Home / Self Care | Payer: Medicaid Other | Source: Ambulatory Visit | Attending: Family | Admitting: Family

## 2015-12-01 ENCOUNTER — Other Ambulatory Visit: Payer: Self-pay | Admitting: Family

## 2015-12-01 DIAGNOSIS — O283 Abnormal ultrasonic finding on antenatal screening of mother: Secondary | ICD-10-CM | POA: Diagnosis present

## 2015-12-01 DIAGNOSIS — O352XX0 Maternal care for (suspected) hereditary disease in fetus, not applicable or unspecified: Secondary | ICD-10-CM | POA: Diagnosis not present

## 2015-12-01 DIAGNOSIS — Z3A23 23 weeks gestation of pregnancy: Secondary | ICD-10-CM | POA: Diagnosis not present

## 2015-12-01 DIAGNOSIS — O09899 Supervision of other high risk pregnancies, unspecified trimester: Secondary | ICD-10-CM

## 2015-12-01 DIAGNOSIS — O09212 Supervision of pregnancy with history of pre-term labor, second trimester: Secondary | ICD-10-CM | POA: Insufficient documentation

## 2015-12-01 DIAGNOSIS — O09299 Supervision of pregnancy with other poor reproductive or obstetric history, unspecified trimester: Secondary | ICD-10-CM

## 2015-12-01 DIAGNOSIS — O34219 Maternal care for unspecified type scar from previous cesarean delivery: Secondary | ICD-10-CM | POA: Diagnosis not present

## 2015-12-01 DIAGNOSIS — O09219 Supervision of pregnancy with history of pre-term labor, unspecified trimester: Secondary | ICD-10-CM

## 2015-12-01 DIAGNOSIS — Q8500 Neurofibromatosis, unspecified: Secondary | ICD-10-CM

## 2015-12-01 DIAGNOSIS — O289 Unspecified abnormal findings on antenatal screening of mother: Secondary | ICD-10-CM

## 2015-12-01 DIAGNOSIS — Z3689 Encounter for other specified antenatal screening: Secondary | ICD-10-CM

## 2015-12-01 DIAGNOSIS — O09292 Supervision of pregnancy with other poor reproductive or obstetric history, second trimester: Secondary | ICD-10-CM

## 2015-12-01 DIAGNOSIS — O0992 Supervision of high risk pregnancy, unspecified, second trimester: Secondary | ICD-10-CM

## 2015-12-05 ENCOUNTER — Other Ambulatory Visit (HOSPITAL_COMMUNITY): Payer: Self-pay

## 2015-12-05 ENCOUNTER — Encounter (HOSPITAL_COMMUNITY): Payer: Self-pay

## 2015-12-06 DIAGNOSIS — Q8500 Neurofibromatosis, unspecified: Secondary | ICD-10-CM | POA: Insufficient documentation

## 2015-12-13 ENCOUNTER — Other Ambulatory Visit: Payer: Self-pay | Admitting: Obstetrics & Gynecology

## 2015-12-15 ENCOUNTER — Ambulatory Visit (INDEPENDENT_AMBULATORY_CARE_PROVIDER_SITE_OTHER): Payer: Medicaid Other | Admitting: Family Medicine

## 2015-12-15 ENCOUNTER — Encounter: Payer: Self-pay | Admitting: Family Medicine

## 2015-12-15 VITALS — BP 132/77 | HR 85 | Temp 98.3°F | Wt 221.0 lb

## 2015-12-15 DIAGNOSIS — Q8500 Neurofibromatosis, unspecified: Secondary | ICD-10-CM | POA: Diagnosis not present

## 2015-12-15 DIAGNOSIS — R772 Abnormality of alphafetoprotein: Secondary | ICD-10-CM | POA: Diagnosis not present

## 2015-12-15 DIAGNOSIS — O09293 Supervision of pregnancy with other poor reproductive or obstetric history, third trimester: Secondary | ICD-10-CM

## 2015-12-15 DIAGNOSIS — O0992 Supervision of high risk pregnancy, unspecified, second trimester: Secondary | ICD-10-CM

## 2015-12-15 DIAGNOSIS — O34219 Maternal care for unspecified type scar from previous cesarean delivery: Secondary | ICD-10-CM | POA: Diagnosis not present

## 2015-12-15 LAB — POCT URINALYSIS DIP (DEVICE)
Glucose, UA: NEGATIVE mg/dL
HGB URINE DIPSTICK: NEGATIVE
Nitrite: NEGATIVE
PH: 5.5 (ref 5.0–8.0)
Protein, ur: 30 mg/dL — AB
SPECIFIC GRAVITY, URINE: 1.025 (ref 1.005–1.030)
Urobilinogen, UA: 1 mg/dL (ref 0.0–1.0)

## 2015-12-15 MED ORDER — PRENATAL PLUS 27-1 MG PO TABS: 1.0000 | ORAL_TABLET | Freq: Every day | ORAL | Status: AC

## 2015-12-15 NOTE — Patient Instructions (Signed)

## 2015-12-15 NOTE — Progress Notes (Signed)
Subjective:  Karen Mckinney is a 29 y.o. [id number] at 4240w6d being seen today for ongoing prenatal care.  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  Patient reports no complaints.  Contractions: Not present. Vag. Bleeding: None.  Movement: Present. Denies leaking of fluid.   The following portions of the patient's history were reviewed and updated as appropriate: allergies, current medications, past family history, past medical history, past social history, past surgical history and problem list. Problem list updated.  Objective:   Filed Vitals:   12/15/15 1212  BP: 141/87  Pulse: 85  Temp: 98.3 F (36.8 C)  Weight: 221 lb (100.245 kg)    Fetal Status: Fetal Heart Rate (bpm): 161   Movement: Present     General:  Alert, oriented and cooperative. Patient is in no acute distress.  Skin: Skin is warm and dry. No rash noted.   Cardiovascular: Normal heart rate noted  Respiratory: Normal respiratory effort, no problems with respiration noted  Abdomen: Soft, gravid, appropriate for gestational age. Pain/Pressure: Present     Pelvic: Vag. Bleeding: None     Cervical exam deferred        Extremities: Normal range of motion.  Edema: None  Mental Status: Normal mood and affect. Normal behavior. Normal judgment and thought content.   Urinalysis: Urine Protein: 1+ Urine Glucose: Negative  Assessment and Plan:  Pregnancy: [id number] at 3540w6d  1. Hx of preeclampsia, prior pregnancy, currently pregnant, third trimester On ASA  2. Supervision of high risk pregnancy, antepartum, second trimester Updated box  3. Previous cesarean section complicating pregnancy Given consent form last visit, TOLAC signed today  4. Neurofibromatosis [facility](HCC) Discussed  genetic counseling, referred today. Appt made  5. Elevated AFP: increased down's risk 1:92. Discussed this result with patient and recommendation to meet with genetic counseling both for this and NF -Recommended/discussed genetic counseling with likely NIPT   Preterm labor symptoms and general obstetric precautions including but not limited to vaginal bleeding, contractions, leaking of fluid and fetal movement were reviewed in detail with the patient. Please refer to After Visit Summary for other counseling recommendations.  Return in about 4 weeks (around 01/12/2016) for Routine prenatal care.   Federico FlakeKimberly Niles [name], MD

## 2015-12-15 NOTE — Progress Notes (Signed)
Pain-pelvic 

## 2015-12-16 ENCOUNTER — Ambulatory Visit (HOSPITAL_COMMUNITY)
Admission: RE | Admit: 2015-12-16 | Discharge: 2015-12-16 | Disposition: A | Discharge status: Home / Self Care | Payer: Medicaid Other | Source: Ambulatory Visit | Attending: Family Medicine | Admitting: Family Medicine

## 2015-12-16 ENCOUNTER — Encounter (HOSPITAL_COMMUNITY): Payer: Self-pay

## 2015-12-16 DIAGNOSIS — O352XX0 Maternal care for (suspected) hereditary disease in fetus, not applicable or unspecified: Secondary | ICD-10-CM

## 2015-12-16 DIAGNOSIS — Q8500 Neurofibromatosis, unspecified: Secondary | ICD-10-CM

## 2015-12-16 DIAGNOSIS — O280 Abnormal hematological finding on antenatal screening of mother: Secondary | ICD-10-CM | POA: Insufficient documentation

## 2015-12-16 DIAGNOSIS — Z3A26 26 weeks gestation of pregnancy: Secondary | ICD-10-CM | POA: Insufficient documentation
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
## 2015-12-19 ENCOUNTER — Encounter: Payer: Self-pay | Admitting: *Deleted

## 2015-12-23 ENCOUNTER — Telehealth (HOSPITAL_COMMUNITY): Payer: Self-pay | Admitting: MS"

## 2015-12-23 NOTE — Telephone Encounter (Signed)
Attempted to call patient regarding results of prenatal cell free DNA testing, which are within normal range. Left message for patient to return call.   Karen BraunKaren Lon Klippel 12/23/2015 12:54 PM

## 2015-12-23 NOTE — Telephone Encounter (Signed)
Called Mayra Reelatoyia Kellen to discuss her prenatal cell free DNA test results.  Ms. Mayra Reelatoyia Tolleson had Panorama testing through [facility].  Testing was offered because of Down syndrome risk from maternal serum screening.   The patient was identified by name and DOB.  We reviewed that these are within normal limits, showing a less than 1 in 10,000 risk for trisomies 21, 18 and 13, and monosomy X (Turner syndrome).  In addition, the risk for triploidy/vanishing twin and sex chromosome trisomies (47,XXX and 47,XXY) was also low risk. We reviewed that this testing identifies > 99% of pregnancies with trisomy 2321, trisomy 3913, sex chromosome trisomies (47,XXX and 47,XXY), and triploidy. The detection rate for trisomy 18 is 96%.  The detection rate for monosomy X is ~92%.  The false positive rate is <0.1% for all conditions. Testing was also consistent with female fetal sex.  The patient did wish to know fetal sex.  She understands that this testing does not identify all genetic conditions.  All questions were answered to her satisfaction, she was encouraged to call with additional questions or concerns.  Quinn PlowmanKaren Tanveer Brammer, MS Certified Genetic Counselor 12/23/2015 1:03 PM

## 2015-12-30 ENCOUNTER — Other Ambulatory Visit (HOSPITAL_COMMUNITY): Payer: Self-pay

## 2015-12-30 ENCOUNTER — Encounter (HOSPITAL_COMMUNITY): Payer: Self-pay | Admitting: *Deleted

## 2015-12-30 ENCOUNTER — Inpatient Hospital Stay (HOSPITAL_COMMUNITY)
Admission: AD | Admit: 2015-12-30 | Discharge: 2016-01-03 | DRG: 765 | Disposition: A | Discharge status: Home / Self Care | Payer: Medicaid Other | Source: Ambulatory Visit | Attending: Obstetrics & Gynecology | Admitting: Obstetrics & Gynecology

## 2015-12-30 ENCOUNTER — Other Ambulatory Visit (HOSPITAL_COMMUNITY): Payer: Self-pay | Admitting: Maternal and Fetal Medicine

## 2015-12-30 ENCOUNTER — Encounter (HOSPITAL_COMMUNITY): Payer: Self-pay

## 2015-12-30 ENCOUNTER — Ambulatory Visit (HOSPITAL_COMMUNITY)
Admission: RE | Admit: 2015-12-30 | Discharge: 2015-12-30 | Disposition: A | Discharge status: Home / Self Care | Payer: Medicaid Other | Source: Ambulatory Visit | Attending: Family Medicine | Admitting: Family Medicine

## 2015-12-30 DIAGNOSIS — Z3A28 28 weeks gestation of pregnancy: Secondary | ICD-10-CM

## 2015-12-30 DIAGNOSIS — O09893 Supervision of other high risk pregnancies, third trimester: Secondary | ICD-10-CM

## 2015-12-30 DIAGNOSIS — O34219 Maternal care for unspecified type scar from previous cesarean delivery: Secondary | ICD-10-CM

## 2015-12-30 DIAGNOSIS — O133 Gestational [pregnancy-induced] hypertension without significant proteinuria, third trimester: Secondary | ICD-10-CM

## 2015-12-30 DIAGNOSIS — IMO0002 Reserved for concepts with insufficient information to code with codable children: Secondary | ICD-10-CM | POA: Diagnosis present

## 2015-12-30 DIAGNOSIS — D649 Anemia, unspecified: Secondary | ICD-10-CM | POA: Diagnosis not present

## 2015-12-30 DIAGNOSIS — O280 Abnormal hematological finding on antenatal screening of mother: Secondary | ICD-10-CM

## 2015-12-30 DIAGNOSIS — R030 Elevated blood-pressure reading, without diagnosis of hypertension: Secondary | ICD-10-CM

## 2015-12-30 DIAGNOSIS — O352XX0 Maternal care for (suspected) hereditary disease in fetus, not applicable or unspecified: Secondary | ICD-10-CM

## 2015-12-30 DIAGNOSIS — O1490 Unspecified pre-eclampsia, unspecified trimester: Secondary | ICD-10-CM

## 2015-12-30 DIAGNOSIS — Z3689 Encounter for other specified antenatal screening: Secondary | ICD-10-CM

## 2015-12-30 DIAGNOSIS — O1494 Unspecified pre-eclampsia, complicating childbirth: Secondary | ICD-10-CM | POA: Diagnosis not present

## 2015-12-30 DIAGNOSIS — O09293 Supervision of pregnancy with other poor reproductive or obstetric history, third trimester: Secondary | ICD-10-CM

## 2015-12-30 DIAGNOSIS — Z7982 Long term (current) use of aspirin: Secondary | ICD-10-CM

## 2015-12-30 DIAGNOSIS — O365930 Maternal care for other known or suspected poor fetal growth, third trimester, not applicable or unspecified: Secondary | ICD-10-CM | POA: Diagnosis not present

## 2015-12-30 DIAGNOSIS — O9081 Anemia of the puerperium: Secondary | ICD-10-CM | POA: Diagnosis not present

## 2015-12-30 DIAGNOSIS — O43193 Other malformation of placenta, third trimester: Secondary | ICD-10-CM

## 2015-12-30 DIAGNOSIS — O99334 Smoking (tobacco) complicating childbirth: Secondary | ICD-10-CM | POA: Diagnosis present

## 2015-12-30 DIAGNOSIS — Z90710 Acquired absence of both cervix and uterus: Secondary | ICD-10-CM | POA: Diagnosis present

## 2015-12-30 DIAGNOSIS — O09213 Supervision of pregnancy with history of pre-term labor, third trimester: Secondary | ICD-10-CM | POA: Insufficient documentation

## 2015-12-30 DIAGNOSIS — Q8500 Neurofibromatosis, unspecified: Secondary | ICD-10-CM | POA: Diagnosis not present

## 2015-12-30 DIAGNOSIS — O281 Abnormal biochemical finding on antenatal screening of mother: Secondary | ICD-10-CM | POA: Insufficient documentation

## 2015-12-30 DIAGNOSIS — O34211 Maternal care for low transverse scar from previous cesarean delivery: Secondary | ICD-10-CM | POA: Diagnosis present

## 2015-12-30 DIAGNOSIS — F17210 Nicotine dependence, cigarettes, uncomplicated: Secondary | ICD-10-CM | POA: Diagnosis present

## 2015-12-30 DIAGNOSIS — O43109 Malformation of placenta, unspecified, unspecified trimester: Secondary | ICD-10-CM | POA: Diagnosis present

## 2015-12-30 DIAGNOSIS — IMO0001 Reserved for inherently not codable concepts without codable children: Secondary | ICD-10-CM | POA: Diagnosis present

## 2015-12-30 LAB — COMPREHENSIVE METABOLIC PANEL
ALBUMIN: 2.9 g/dL — AB (ref 3.5–5.0)
ALT: 13 U/L — AB (ref 14–54)
AST: 22 U/L (ref 15–41)
Alkaline Phosphatase: 149 U/L — ABNORMAL HIGH (ref 38–126)
Anion gap: 9 (ref 5–15)
BILIRUBIN TOTAL: 0.4 mg/dL (ref 0.3–1.2)
BUN: 14 mg/dL (ref 6–20)
CALCIUM: 9 mg/dL (ref 8.9–10.3)
CO2: 18 mmol/L — AB (ref 22–32)
CREATININE: 0.76 mg/dL (ref 0.44–1.00)
Chloride: 109 mmol/L (ref 101–111)
GFR calc Af Amer: >60 mL/min (ref >60)
GLUCOSE: 88 mg/dL (ref 65–99)
Potassium: 4.9 mmol/L (ref 3.5–5.1)
Sodium: 136 mmol/L (ref 135–145)
Total Protein: 6.1 g/dL — ABNORMAL LOW (ref 6.5–8.1)

## 2015-12-30 LAB — CBC
HCT: 36.9 % (ref 36.0–46.0)
HEMOGLOBIN: 12.5 g/dL (ref 12.0–15.0)
MCH: 28.7 pg (ref 26.0–34.0)
MCHC: 33.9 g/dL (ref 30.0–36.0)
MCV: 84.6 fL (ref 78.0–100.0)
PLATELETS: 345 10*3/uL (ref 150–400)
RBC: 4.36 MIL/uL (ref 3.87–5.11)
RDW: 14.8 % (ref 11.5–15.5)
WBC: 9.2 10*3/uL (ref 4.0–10.5)

## 2015-12-30 LAB — URINALYSIS, ROUTINE W REFLEX MICROSCOPIC
Bilirubin Urine: NEGATIVE
GLUCOSE, UA: NEGATIVE mg/dL
Hgb urine dipstick: NEGATIVE
Ketones, ur: NEGATIVE mg/dL
LEUKOCYTES UA: NEGATIVE
NITRITE: NEGATIVE
PH: 6 (ref 5.0–8.0)
PROTEIN: 100 mg/dL — AB
Specific Gravity, Urine: 1.025 (ref 1.005–1.030)

## 2015-12-30 LAB — TYPE AND SCREEN
ABO/RH(D): O POS
Antibody Screen: NEGATIVE

## 2015-12-30 LAB — URINE MICROSCOPIC-ADD ON

## 2015-12-30 LAB — ABO/RH: ABO/RH(D): O POS

## 2015-12-30 MED ORDER — ASPIRIN EC 81 MG PO TBEC
81.0000 mg | DELAYED_RELEASE_TABLET | Freq: Every day | ORAL | Status: DC
  Administered 2015-12-31 – 2016-01-01 (×2): 81 mg via ORAL
  Filled 2015-12-30 (×4): qty 1

## 2015-12-30 MED ORDER — PRENATAL MULTIVITAMIN CH
1.0000 | ORAL_TABLET | Freq: Every day | ORAL | Status: DC
  Administered 2015-12-31 – 2016-01-01 (×2): 1 via ORAL
  Filled 2015-12-30 (×2): qty 1

## 2015-12-30 MED ORDER — ACETAMINOPHEN 325 MG PO TABS
650.0000 mg | ORAL_TABLET | ORAL | Status: DC | PRN
  Administered 2015-12-31: 650 mg via ORAL
  Filled 2015-12-30: qty 2

## 2015-12-30 MED ORDER — CALCIUM CARBONATE ANTACID 500 MG PO CHEW: 2.0000 | CHEWABLE_TABLET | ORAL | Status: DC | PRN

## 2015-12-30 MED ORDER — DOCUSATE SODIUM 100 MG PO CAPS
100.0000 mg | ORAL_CAPSULE | Freq: Every day | ORAL | Status: DC
Start: 2015-12-30 — End: 2016-01-01
  Administered 2015-12-31 – 2016-01-01 (×2): 100 mg via ORAL
  Filled 2015-12-30 (×2): qty 1

## 2015-12-30 MED ORDER — ZOLPIDEM TARTRATE 5 MG PO TABS: 5.0000 mg | ORAL_TABLET | Freq: Every evening | ORAL | Status: DC | PRN

## 2015-12-30 MED ORDER — BETAMETHASONE SOD PHOS & ACET 6 (3-3) MG/ML IJ SUSP
12.0000 mg | INTRAMUSCULAR | Status: AC
  Administered 2015-12-30 – 2015-12-31 (×2): 12 mg via INTRAMUSCULAR
  Filled 2015-12-30 (×2): qty 2
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
## 2015-12-30 NOTE — ED Notes (Signed)
Spoke with Dr. Adrian BlackwaterStinson, Pt to go to room 160 for admission.

## 2015-12-31 ENCOUNTER — Encounter (HOSPITAL_COMMUNITY): Payer: Self-pay | Admitting: Anesthesiology

## 2015-12-31 LAB — CREATININE, URINE, 24 HOUR
COLLECTION INTERVAL-UCRE24: 24 h
CREATININE 24H UR: 1582 mg/d (ref 600–1800)
CREATININE, URINE: 91.73 mg/dL
URINE TOTAL VOLUME-UCRE24: 1725 mL

## 2015-12-31 LAB — PROTEIN, URINE, 24 HOUR
Collection Interval-UPROT: 24 hours
PROTEIN, 24H URINE: 1432 mg/d — AB (ref 50–100)
PROTEIN, URINE: 83 mg/dL
Urine Total Volume-UPROT: 1725 mL

## 2015-12-31 NOTE — Progress Notes (Signed)
FACULTY PRACTICE ANTEPARTUM(COMPREHENSIVE) NOTE  Karen Mckinney is a 30 y.o. [id number] at 5315w1d who is admitted for abnrmal cord dopplers and growth restriction for monitoring and betamethasome.    Length of Stay:  1  Days  Subjective: No complaints Patient reports the fetal movement as active. Patient reports uterine contraction  activity as none. Patient reports  vaginal bleeding as none. Patient describes fluid per vagina as None.  Vitals:  Blood pressure 138/78, pulse 73, temperature 98.3 F (36.8 C), temperature source Oral, resp. rate 18, height 5\' 6"  (1.676 m), weight 102.059 kg (225 lb), last menstrual period 07/01/2015. Physical Examination:  General appearance - alert, well appearing, and in no distress Heart - normal rate and regular rhythm Abdomen - soft, nontender, nondistended Fundal Height:  size equals dates Cervical Exam: Not evaluated.  Extremities: extremities normal, atraumatic, no cyanosis or edema and Homans sign is negative, no sign of DVT Membranes:intact  Fetal Monitoring:     Fetal Heart Rate A      Mode  External [Applied] filed at 12/31/2015 1010    Baseline Rate (A)  150 bpm filed at 12/31/2015 1044    Variability  6-25 BPM filed at 12/31/2015 1044    Accelerations  None filed at 12/31/2015 1044    Decelerations  None filed at 12/31/2015 1044        Labs:  Results for orders placed or performed during the hospital encounter of 12/30/15 (from the past 24 hour(s))  Comprehensive metabolic panel   Collection Time: 12/30/15  1:15 PM  Result Value Ref Range   Sodium 136 135 - 145 mmol/L   Potassium 4.9 3.5 - 5.1 mmol/L   Chloride 109 101 - 111 mmol/L   CO2 18 (L) 22 - 32 mmol/L   Glucose, Bld 88 65 - 99 mg/dL   BUN 14 6 - 20 mg/dL   Creatinine, Ser 4.540.76 0.44 - 1.00 mg/dL   Calcium 9.0 8.9 - 09.810.3 mg/dL   Total Protein 6.1 (L) 6.5 - 8.1 g/dL   Albumin 2.9 (L) 3.5 - 5.0 g/dL   AST 22 15 - 41 U/L   ALT 13 (L) 14 - 54 U/L   Alkaline  Phosphatase 149 (H) 38 - 126 U/L   Total Bilirubin 0.4 0.3 - 1.2 mg/dL   GFR calc non Af Amer >60 >60 mL/min   GFR calc Af Amer >60 >60 mL/min   Anion gap 9 5 - 15  CBC   Collection Time: 12/30/15  1:15 PM  Result Value Ref Range   WBC 9.2 4.0 - 10.5 K/uL   RBC 4.36 3.87 - 5.11 MIL/uL   Hemoglobin 12.5 12.0 - 15.0 g/dL   HCT 11.936.9 14.736.0 - 82.946.0 %   MCV 84.6 78.0 - 100.0 fL   MCH 28.7 26.0 - 34.0 pg   MCHC 33.9 30.0 - 36.0 g/dL   RDW 56.214.8 13.011.5 - 86.515.5 %   Platelets 345 150 - 400 K/uL  Type and screen [facility]   Collection Time: 12/30/15  1:15 PM  Result Value Ref Range   ABO/RH(D) O POS    Antibody Screen NEG    Sample Expiration 01/02/2016   ABO/Rh   Collection Time: 12/30/15  1:15 PM  Result Value Ref Range   ABO/RH(D) O POS   Urinalysis, Routine w reflex microscopic (not at [facility])   Collection Time: 12/30/15  3:40 PM  Result Value Ref Range   Color, Urine YELLOW YELLOW   APPearance CLEAR CLEAR  Specific Gravity, Urine 1.025 1.005 - 1.030   pH 6.0 5.0 - 8.0   Glucose, UA NEGATIVE NEGATIVE mg/dL   Hgb urine dipstick NEGATIVE NEGATIVE   Bilirubin Urine NEGATIVE NEGATIVE   Ketones, ur NEGATIVE NEGATIVE mg/dL   Protein, ur 960 (A) NEGATIVE mg/dL   Nitrite NEGATIVE NEGATIVE   Leukocytes, UA NEGATIVE NEGATIVE  Urine microscopic-add on   Collection Time: 12/30/15  3:40 PM  Result Value Ref Range   Squamous Epithelial / LPF 6-30 (A) NONE SEEN   WBC, UA 0-5 0 - 5 WBC/hpf   RBC / HPF 0-5 0 - 5 RBC/hpf   Bacteria, UA FEW (A) NONE SEEN    Imaging Studies:    Currently EPIC will not allow sonographic studies to automatically populate into notes.  In the meantime, copy and paste results into note or free text. Occasional absent EDF Medications:  Scheduled . aspirin EC  81 mg Oral Daily  . betamethasone acetate-betamethasone sodium phosphate  12 mg Intramuscular Q24H  . docusate sodium  100 mg Oral Daily  . prenatal multivitamin  1 tablet Oral Q1200   I  have reviewed the patient's current medications.  ASSESSMENT: Patient Active Problem List   Diagnosis Date Noted  . IUGR (intrauterine growth restriction) 12/30/2015  . Elevated BP 12/30/2015  . Abnormal placenta, antepartum 12/30/2015  . Abnormal maternal serum screening test 12/16/2015  . Hereditary disease in family possibly affecting fetus, affecting management of mother, antepartum condition or complication   . [redacted] weeks gestation of pregnancy   . 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 Supervision of high risk pregnancy, antepartum 11/03/2015  . Previous cesarean section complicating pregnancy 11/03/2015  . Hx of preeclampsia, prior pregnancy, currently pregnant 11/03/2015    PLAN: Monitor Q shift, betamethasone IM, repeat dopplers in 2 days  Karen Mckinney 12/31/2015,10:47 AM

## 2015-12-31 NOTE — Progress Notes (Signed)
Patient with history of neurofibromatosis. Requested full MRI of spine in order to plan for delivery and perform neuraxial anesthesia. Discussed with Dr. Debroah LoopArnold. Thanks and please contact anesthesia with any additional questions or concerns.

## 2016-01-01 ENCOUNTER — Inpatient Hospital Stay (HOSPITAL_COMMUNITY): Payer: Medicaid Other | Admitting: Anesthesiology

## 2016-01-01 ENCOUNTER — Encounter (HOSPITAL_COMMUNITY): Payer: Self-pay | Admitting: *Deleted

## 2016-01-01 ENCOUNTER — Encounter (HOSPITAL_COMMUNITY)
Admission: AD | Disposition: A | Discharge status: Home / Self Care | Payer: Self-pay | Source: Ambulatory Visit | Attending: Family Medicine

## 2016-01-01 DIAGNOSIS — Z3A28 28 weeks gestation of pregnancy: Secondary | ICD-10-CM

## 2016-01-01 DIAGNOSIS — O34219 Maternal care for unspecified type scar from previous cesarean delivery: Secondary | ICD-10-CM

## 2016-01-01 DIAGNOSIS — O365930 Maternal care for other known or suspected poor fetal growth, third trimester, not applicable or unspecified: Secondary | ICD-10-CM

## 2016-01-01 DIAGNOSIS — Z90710 Acquired absence of both cervix and uterus: Secondary | ICD-10-CM | POA: Diagnosis present

## 2016-01-01 SURGERY — Surgical Case
Anesthesia: General

## 2016-01-01 MED ORDER — LACTATED RINGERS IV SOLN
INTRAVENOUS | Status: DC | PRN
  Administered 2016-01-01 (×2): via INTRAVENOUS

## 2016-01-01 MED ORDER — HYDROMORPHONE HCL 1 MG/ML IJ SOLN
1.0000 mg | INTRAMUSCULAR | Status: DC | PRN
  Administered 2016-01-01: 1 mg via INTRAVENOUS
  Filled 2016-01-01: qty 1

## 2016-01-01 MED ORDER — LACTATED RINGERS IV SOLN
INTRAVENOUS | Status: DC | PRN
  Administered 2016-01-01: 13:00:00 via INTRAVENOUS

## 2016-01-01 MED ORDER — LIDOCAINE HCL (CARDIAC) 20 MG/ML IV SOLN
INTRAVENOUS | Status: AC
  Filled 2016-01-01: qty 5

## 2016-01-01 MED ORDER — METHYLERGONOVINE MALEATE 0.2 MG/ML IJ SOLN: 0.2000 mg | Freq: Once | INTRAMUSCULAR | Status: DC

## 2016-01-01 MED ORDER — PROPOFOL 10 MG/ML IV BOLUS
INTRAVENOUS | Status: AC
  Filled 2016-01-01: qty 40

## 2016-01-01 MED ORDER — ESMOLOL HCL 100 MG/10ML IV SOLN
INTRAVENOUS | Status: DC | PRN
  Administered 2016-01-01: 10 mg via INTRAVENOUS
  Administered 2016-01-01: 30 mg via INTRAVENOUS

## 2016-01-01 MED ORDER — HYDROMORPHONE HCL 1 MG/ML IJ SOLN
INTRAMUSCULAR | Status: DC | PRN
  Administered 2016-01-01 (×2): 0.5 mg via INTRAVENOUS

## 2016-01-01 MED ORDER — SENNOSIDES-DOCUSATE SODIUM 8.6-50 MG PO TABS
2.0000 | ORAL_TABLET | ORAL | Status: DC
  Administered 2016-01-01 – 2016-01-02 (×2): 2 via ORAL
  Filled 2016-01-01 (×2): qty 2

## 2016-01-01 MED ORDER — OXYTOCIN 10 UNIT/ML IJ SOLN
INTRAVENOUS | Status: AC
  Filled 2016-01-01: qty 4

## 2016-01-01 MED ORDER — DIBUCAINE 1 % RE OINT
1.0000 | TOPICAL_OINTMENT | RECTAL | Status: DC | PRN
Start: 2016-01-01 — End: 2016-01-03

## 2016-01-01 MED ORDER — LIDOCAINE HCL (CARDIAC) 20 MG/ML IV SOLN
INTRAVENOUS | Status: DC | PRN
  Administered 2016-01-01: 30 mg via INTRAVENOUS

## 2016-01-01 MED ORDER — HYDROMORPHONE HCL 1 MG/ML IJ SOLN
INTRAMUSCULAR | Status: AC
  Administered 2016-01-01: 0.5 mg via INTRAVENOUS
  Filled 2016-01-01: qty 1

## 2016-01-01 MED ORDER — FENTANYL CITRATE (PF) 250 MCG/5ML IJ SOLN
INTRAMUSCULAR | Status: AC
  Filled 2016-01-01: qty 5

## 2016-01-01 MED ORDER — MIDAZOLAM HCL 2 MG/2ML IJ SOLN
INTRAMUSCULAR | Status: DC | PRN
  Administered 2016-01-01: 2 mg via INTRAVENOUS

## 2016-01-01 MED ORDER — PRENATAL MULTIVITAMIN CH
1.0000 | ORAL_TABLET | Freq: Every day | ORAL | Status: DC
  Administered 2016-01-02 – 2016-01-03 (×2): 1 via ORAL
  Filled 2016-01-01 (×2): qty 1

## 2016-01-01 MED ORDER — LACTATED RINGERS IV SOLN: INTRAVENOUS | Status: DC

## 2016-01-01 MED ORDER — CITRIC ACID-SODIUM CITRATE 334-500 MG/5ML PO SOLN
ORAL | Status: AC
  Administered 2016-01-01: 12:00:00
  Filled 2016-01-01: qty 15

## 2016-01-01 MED ORDER — WITCH HAZEL-GLYCERIN EX PADS: 1.0000 "application " | MEDICATED_PAD | CUTANEOUS | Status: DC | PRN

## 2016-01-01 MED ORDER — ONDANSETRON HCL 4 MG/2ML IJ SOLN
INTRAMUSCULAR | Status: AC
  Filled 2016-01-01: qty 2

## 2016-01-01 MED ORDER — METHYLERGONOVINE MALEATE 0.2 MG PO TABS
0.2000 mg | ORAL_TABLET | Freq: Four times a day (QID) | ORAL | Status: DC
  Administered 2016-01-01 – 2016-01-03 (×7): 0.2 mg via ORAL
  Filled 2016-01-01 (×7): qty 1

## 2016-01-01 MED ORDER — OXYCODONE-ACETAMINOPHEN 5-325 MG PO TABS
1.0000 | ORAL_TABLET | ORAL | Status: DC | PRN
  Administered 2016-01-01: 2 via ORAL
  Administered 2016-01-02 (×3): 1 via ORAL
  Filled 2016-01-01 (×3): qty 1
  Filled 2016-01-01: qty 2

## 2016-01-01 MED ORDER — SIMETHICONE 80 MG PO CHEW
80.0000 mg | CHEWABLE_TABLET | Freq: Three times a day (TID) | ORAL | Status: DC
  Administered 2016-01-02 – 2016-01-03 (×5): 80 mg via ORAL
  Filled 2016-01-01 (×5): qty 1

## 2016-01-01 MED ORDER — FENTANYL CITRATE (PF) 100 MCG/2ML IJ SOLN
INTRAMUSCULAR | Status: DC | PRN
  Administered 2016-01-01: 25 ug via INTRAVENOUS
  Administered 2016-01-01: 150 ug via INTRAVENOUS
  Administered 2016-01-01: 25 ug via INTRAVENOUS
  Administered 2016-01-01 (×2): 50 ug via INTRAVENOUS
  Administered 2016-01-01: 25 ug via INTRAVENOUS

## 2016-01-01 MED ORDER — DIPHENHYDRAMINE HCL 25 MG PO CAPS: 25.0000 mg | ORAL_CAPSULE | Freq: Four times a day (QID) | ORAL | Status: DC | PRN

## 2016-01-01 MED ORDER — LANOLIN HYDROUS EX OINT
1.0000 | TOPICAL_OINTMENT | CUTANEOUS | Status: DC | PRN
Start: 2016-01-01 — End: 2016-01-03

## 2016-01-01 MED ORDER — FENTANYL CITRATE (PF) 100 MCG/2ML IJ SOLN
INTRAMUSCULAR | Status: AC
Start: 2016-01-01 — End: 2016-01-01
  Filled 2016-01-01: qty 2

## 2016-01-01 MED ORDER — PROPOFOL 10 MG/ML IV BOLUS
INTRAVENOUS | Status: DC | PRN
  Administered 2016-01-01 (×6): 20 mg via INTRAVENOUS
  Administered 2016-01-01: 200 mg via INTRAVENOUS

## 2016-01-01 MED ORDER — METHYLERGONOVINE MALEATE 0.2 MG/ML IJ SOLN
INTRAMUSCULAR | Status: DC | PRN
  Administered 2016-01-01: 0.2 mg via INTRAMUSCULAR

## 2016-01-01 MED ORDER — MEPERIDINE HCL 25 MG/ML IJ SOLN: 6.2500 mg | INTRAMUSCULAR | Status: DC | PRN

## 2016-01-01 MED ORDER — SIMETHICONE 80 MG PO CHEW
80.0000 mg | CHEWABLE_TABLET | ORAL | Status: DC
  Administered 2016-01-01: 80 mg via ORAL
  Filled 2016-01-01: qty 1

## 2016-01-01 MED ORDER — MENTHOL 3 MG MT LOZG: 1.0000 | LOZENGE | OROMUCOSAL | Status: DC | PRN

## 2016-01-01 MED ORDER — OXYTOCIN 10 UNIT/ML IJ SOLN
INTRAMUSCULAR | Status: AC
  Filled 2016-01-01: qty 4

## 2016-01-01 MED ORDER — SIMETHICONE 80 MG PO CHEW: 80.0000 mg | CHEWABLE_TABLET | ORAL | Status: DC | PRN

## 2016-01-01 MED ORDER — TETANUS-DIPHTH-ACELL PERTUSSIS 5-2.5-18.5 LF-MCG/0.5 IM SUSP: 0.5000 mL | Freq: Once | INTRAMUSCULAR | Status: DC

## 2016-01-01 MED ORDER — CEFAZOLIN SODIUM-DEXTROSE 2-3 GM-% IV SOLR
INTRAVENOUS | Status: AC
  Filled 2016-01-01: qty 50

## 2016-01-01 MED ORDER — TERBUTALINE SULFATE 1 MG/ML IJ SOLN
INTRAMUSCULAR | Status: AC
  Filled 2016-01-01: qty 1

## 2016-01-01 MED ORDER — OXYTOCIN 10 UNIT/ML IJ SOLN
40.0000 [IU] | INTRAVENOUS | Status: DC | PRN
  Administered 2016-01-01: 40 [IU] via INTRAVENOUS

## 2016-01-01 MED ORDER — HYDROMORPHONE HCL 1 MG/ML IJ SOLN
0.2500 mg | INTRAMUSCULAR | Status: DC | PRN
  Administered 2016-01-01 (×2): 0.25 mg via INTRAVENOUS
  Administered 2016-01-01: 0.5 mg via INTRAVENOUS

## 2016-01-01 MED ORDER — LACTATED RINGERS IV BOLUS (SEPSIS)
500.0000 mL | Freq: Once | INTRAVENOUS | Status: AC
  Administered 2016-01-01: 500 mL via INTRAVENOUS

## 2016-01-01 MED ORDER — CEFAZOLIN SODIUM-DEXTROSE 2-3 GM-% IV SOLR
INTRAVENOUS | Status: DC | PRN
  Administered 2016-01-01: 2 g via INTRAVENOUS

## 2016-01-01 MED ORDER — SUCCINYLCHOLINE CHLORIDE 20 MG/ML IJ SOLN
INTRAMUSCULAR | Status: AC
  Filled 2016-01-01: qty 1

## 2016-01-01 MED ORDER — HYDROMORPHONE HCL 1 MG/ML IJ SOLN
INTRAMUSCULAR | Status: AC
  Filled 2016-01-01: qty 1

## 2016-01-01 MED ORDER — ACETAMINOPHEN 325 MG PO TABS: 650.0000 mg | ORAL_TABLET | ORAL | Status: DC | PRN

## 2016-01-01 MED ORDER — CARBOPROST TROMETHAMINE 250 MCG/ML IM SOLN: 1000.0000 ug | Freq: Once | INTRAMUSCULAR | Status: DC

## 2016-01-01 MED ORDER — ZOLPIDEM TARTRATE 5 MG PO TABS: 5.0000 mg | ORAL_TABLET | Freq: Every evening | ORAL | Status: DC | PRN

## 2016-01-01 MED ORDER — TERBUTALINE SULFATE 1 MG/ML IJ SOLN
0.2500 mg | Freq: Once | INTRAMUSCULAR | Status: AC
  Administered 2016-01-01: 0.25 mg via SUBCUTANEOUS

## 2016-01-01 MED ORDER — LACTATED RINGERS IV SOLN
2.5000 [IU]/h | INTRAVENOUS | Status: AC
  Administered 2016-01-01: 2.5 [IU]/h via INTRAVENOUS

## 2016-01-01 MED ORDER — PROMETHAZINE HCL 25 MG/ML IJ SOLN: 6.2500 mg | INTRAMUSCULAR | Status: DC | PRN

## 2016-01-01 MED ORDER — CARBOPROST TROMETHAMINE 250 MCG/ML IM SOLN
INTRAMUSCULAR | Status: DC | PRN
  Administered 2016-01-01: 250 ug via INTRAMUSCULAR
  Administered 2016-01-01: 1000 ug via INTRAMUSCULAR

## 2016-01-01 MED ORDER — IBUPROFEN 600 MG PO TABS
600.0000 mg | ORAL_TABLET | Freq: Four times a day (QID) | ORAL | Status: DC
  Administered 2016-01-01 – 2016-01-03 (×7): 600 mg via ORAL
  Filled 2016-01-01 (×7): qty 1

## 2016-01-01 MED ORDER — MIDAZOLAM HCL 2 MG/2ML IJ SOLN
INTRAMUSCULAR | Status: AC
Start: 2016-01-01 — End: 2016-01-01
  Filled 2016-01-01: qty 2

## 2016-01-01 SURGICAL SUPPLY — 42 items
APL SKNCLS STERI-STRIP NONHPOA (GAUZE/BANDAGES/DRESSINGS) ×1
BENZOIN TINCTURE PRP APPL 2/3 (GAUZE/BANDAGES/DRESSINGS) ×2 IMPLANT
CLAMP CORD UMBIL (MISCELLANEOUS) IMPLANT
CLOSURE STERI STRIP 1/2 X4 (GAUZE/BANDAGES/DRESSINGS) ×2 IMPLANT
CLOTH BEACON ORANGE TIMEOUT ST (SAFETY) ×3 IMPLANT
DRAPE SHEET LG 3/4 BI-LAMINATE (DRAPES) IMPLANT
DRSG OPSITE POSTOP 4X10 (GAUZE/BANDAGES/DRESSINGS) ×3 IMPLANT
DURAPREP 26ML APPLICATOR (WOUND CARE) ×6 IMPLANT
ELECT REM PT RETURN 9FT ADLT (ELECTROSURGICAL) ×3
ELECTRODE REM PT RTRN 9FT ADLT (ELECTROSURGICAL) ×1 IMPLANT
EXTRACTOR VACUUM BELL STYLE (SUCTIONS) IMPLANT
GLOVE BIOGEL PI IND STRL 7.0 (GLOVE) ×1 IMPLANT
GLOVE BIOGEL PI IND STRL 8 (GLOVE) ×1 IMPLANT
GLOVE BIOGEL PI INDICATOR 7.0 (GLOVE) ×2
GLOVE BIOGEL PI INDICATOR 8 (GLOVE) ×2
GLOVE ECLIPSE 8.0 STRL XLNG CF (GLOVE) ×3 IMPLANT
GOWN STRL REUS W/TWL LRG LVL3 (GOWN DISPOSABLE) ×6 IMPLANT
KIT ABG SYR 3ML LUER SLIP (SYRINGE) ×3 IMPLANT
LIQUID BAND (GAUZE/BANDAGES/DRESSINGS) ×6 IMPLANT
NDL HYPO 18GX1.5 BLUNT FILL (NEEDLE) ×1 IMPLANT
NDL HYPO 25X5/8 SAFETYGLIDE (NEEDLE) ×1 IMPLANT
NEEDLE HYPO 18GX1.5 BLUNT FILL (NEEDLE) ×3 IMPLANT
NEEDLE HYPO 22GX1.5 SAFETY (NEEDLE) ×3 IMPLANT
NEEDLE HYPO 25X5/8 SAFETYGLIDE (NEEDLE) ×3 IMPLANT
NS IRRIG 1000ML POUR BTL (IV SOLUTION) ×3 IMPLANT
PACK C SECTION WH (CUSTOM PROCEDURE TRAY) ×3 IMPLANT
PAD OB MATERNITY 4.3X12.25 (PERSONAL CARE ITEMS) ×3 IMPLANT
PENCIL SMOKE EVAC W/HOLSTER (ELECTROSURGICAL) ×3 IMPLANT
RTRCTR C-SECT PINK 25CM LRG (MISCELLANEOUS) IMPLANT
STAPLER VISISTAT 35W (STAPLE) ×2 IMPLANT
SUT CHROMIC 0 CT 1 (SUTURE) ×3 IMPLANT
SUT MNCRL 0 VIOLET CTX 36 (SUTURE) ×2 IMPLANT
SUT MONOCRYL 0 CTX 36 (SUTURE) ×4
SUT PLAIN 2 0 (SUTURE)
SUT PLAIN 2 0 XLH (SUTURE) IMPLANT
SUT PLAIN ABS 2-0 CT1 27XMFL (SUTURE) IMPLANT
SUT VIC AB 0 CTX 36 (SUTURE) ×3
SUT VIC AB 0 CTX36XBRD ANBCTRL (SUTURE) ×1 IMPLANT
SUT VIC AB 4-0 KS 27 (SUTURE) ×2 IMPLANT
SYR 20CC LL (SYRINGE) ×6 IMPLANT
TOWEL OR 17X24 6PK STRL BLUE (TOWEL DISPOSABLE) ×3 IMPLANT
TRAY FOLEY CATH SILVER 14FR (SET/KITS/TRAYS/PACK) IMPLANT

## 2016-01-01 NOTE — Anesthesia Postprocedure Evaluation (Signed)
Anesthesia Post Note  Patient: Karen Mckinney  Procedure(s) Performed: Procedure(s) (LRB): CESAREAN SECTION (N/A)  Patient location during evaluation: PACU Anesthesia Type: General Level of consciousness: awake and alert Pain management: pain level controlled Vital Signs Assessment: post-procedure vital signs reviewed and stable Respiratory status: spontaneous breathing, nonlabored ventilation, respiratory function stable and patient connected to nasal cannula oxygen Cardiovascular status: blood pressure returned to baseline and stable Postop Assessment: no signs of nausea or vomiting Anesthetic complications: no    Last Vitals:  Filed Vitals:   01/01/16 1415 01/01/16 1430  BP: 154/99 148/101  Pulse: 94 87  Temp:    Resp: 19 16    Last Pain:  Filed Vitals:   01/01/16 1433  PainSc: 9                  Phillips Groutarignan, Shafin Pollio

## 2016-01-01 NOTE — Transfer of Care (Signed)
Immediate Anesthesia Transfer of Care Note  Patient: Karen Mckinney  Procedure(s) Performed: Procedure(s): CESAREAN SECTION (N/A)  Patient Location: PACU  Anesthesia Type:General  Level of Consciousness: awake and alert   Airway & Oxygen Therapy: Patient Spontanous Breathing and Patient connected to nasal cannula oxygen  Post-op Assessment: Report given to RN and Post -op Vital signs reviewed and stable  Post vital signs: Reviewed  Last Vitals:  Filed Vitals:   01/01/16 1344 01/01/16 1345  BP: 162/101 148/98  Pulse: 90 104  Temp: 37.1 C   Resp:      Complications: No apparent anesthesia complications

## 2016-01-01 NOTE — Anesthesia Preprocedure Evaluation (Addendum)
Anesthesia Evaluation  Preop documentation limited or incomplete due to emergent nature of procedure.  Airway Mallampati: II  TM Distance: >3 FB Neck ROM: Full    Dental no notable dental hx.    Pulmonary Current Smoker,    Pulmonary exam normal breath sounds clear to auscultation       Cardiovascular Normal cardiovascular exam Rhythm:Regular Rate:Normal     Neuro/Psych neurofibromatosis    GI/Hepatic   Endo/Other    Renal/GU      Musculoskeletal   Abdominal   Peds  Hematology   Anesthesia Other Findings   Reproductive/Obstetrics                            Anesthesia Physical Anesthesia Plan  ASA: II and emergent  Anesthesia Plan: General   Post-op Pain Management:    Induction: Intravenous, Rapid sequence and Cricoid pressure planned  Airway Management Planned: Oral ETT and Video Laryngoscope Planned  Additional Equipment:   Intra-op Plan:   Post-operative Plan: Extubation in OR  Informed Consent: I have reviewed the patients History and Physical, chart, labs and discussed the procedure including the risks, benefits and alternatives for the proposed anesthesia with the patient or authorized representative who has indicated his/her understanding and acceptance.   Dental advisory given  Plan Discussed with: CRNA  Anesthesia Plan Comments: (STAT C/S. Surgeon requests GA)       Anesthesia Quick Evaluation

## 2016-01-01 NOTE — Progress Notes (Signed)
FACULTY PRACTICE ANTEPARTUM(COMPREHENSIVE) NOTE  Karen Mckinney is a 30 y.o. [id number] at 1967w2d by early ultrasound who is admitted for abnormal cord doppler and IUGR.   Fetal presentation is unsure. Length of Stay:  2  Days  Subjective:  Patient reports the fetal movement as active. Patient reports uterine contraction  activity as none. Patient reports  vaginal bleeding as none. Patient describes fluid per vagina as None.  Vitals:  Blood pressure 146/79, pulse 74, temperature 98.5 F (36.9 C), temperature source Oral, resp. rate 14, height 5\' 6"  (1.676 m), weight 102.059 kg (225 lb), last menstrual period 07/01/2015. Physical Examination:  General appearance - alert, well appearing, and in no distress Heart - normal rate and regular rhythm Abdomen - soft, nontender, nondistended Fundal Height:  size equals dates Cervical Exam: Not evaluated. Extremities: extremities normal, atraumatic, no cyanosis or edema and Homans sign is negative, no sign of DVT  Membranes:intact  Fetal Monitoring:   Fetal Heart Rate A      Mode  -- [off] filed at 01/01/2016 0008    Baseline Rate (A)  150 bpm filed at 12/31/2015 2314    Variability  6-25 BPM filed at 12/31/2015 2314    Accelerations  10 x 10 filed at 12/31/2015 2314    Decelerations  Variable filed at 12/31/2015 2314        Labs:  No results found for this or any previous visit (from the past 24 hour(s)).  Imaging Studies:     Currently EPIC will not allow sonographic studies to automatically populate into notes.  In the meantime, copy and paste results into note or free text.  Medications:  Scheduled . aspirin EC  81 mg Oral Daily  . docusate sodium  100 mg Oral Daily  . prenatal multivitamin  1 tablet Oral Q1200   I have reviewed the patient's current medications.  ASSESSMENT: Patient Active Problem List   Diagnosis Date Noted  . IUGR (intrauterine growth restriction) 12/30/2015  . Elevated BP 12/30/2015  . Abnormal  placenta, antepartum 12/30/2015  . Abnormal maternal serum screening test 12/16/2015  . Hereditary disease in family possibly affecting fetus, affecting management of mother, antepartum condition or complication   . [redacted] weeks gestation of pregnancy   . 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 Supervision of high risk pregnancy, antepartum 11/03/2015  . Previous cesarean section complicating pregnancy 11/03/2015  . Hx of preeclampsia, prior pregnancy, currently pregnant 11/03/2015    PLAN: Repeat US dopplers tomorrow   ARNOLD,JAMES 01/01/2016,6:37 AM

## 2016-01-01 NOTE — Op Note (Signed)
Preoperative diagnosis:  1.  Intrauterine pregnancy at 5966w2d  weeks gestation                                         2.  IUGR, all biometric measures less than 5%                                         3.  Intermittent absent Doppler flow abnormalities                                         4.  Large intravillous thrombus                                         5.  Fetal bradycardia, unresponsive                                         6.  Neurofibromatosis                                         7.  Previous Caesarean section   Postoperative diagnosis:  Same as above + Couvelaire anterior uterine wall  Procedure: Repeat cesarean section  Surgeon:  Lazaro ArmsLuther H Eure MD  Assistant:    Anesthesia: General  Findings:  Patient was undergoing routine monitoring which she has been re-times daily since her admission 3 days ago.  She's well 9 because of significant growth prescription at [redacted] weeks gestation.  She had artery received 2 doses of betamethasone.  She had a Doppler flow study 2 days ago which revealed intermittent absent Doppler flow with elevated ratios.  Sonogram had also revealed a large collection of blood involving the posterior wall of the placenta called and intervillous thrombus.  Relatively speaking it was interfering with the uteroplacental blood flow probably at least 25% of the placental surface.    During this routine monitoring the fetal heart rate was showing variable decelerations down to the 90s with return to baseline and good reactivity.  However after number of these variable decelerations the fetal heart rate went to the 90s even the 80s and did not recover despite our best efforts.  I was in the room while this was going 9 confirmed it with ultrasound was moving the patient side to side putting on oxygen and we were unable to normalize the fetal heart rate.  As a result I'll say approximately 12:15 to 1217 I called a code cesarean section for a emergent cesarean section.   I informed Dr. Montel CulverKerrigan that the patient needed to go to sleep due to the fetal status.  He agreed   Over a low transverse incision was delivered a viable female with Apgars of 5 and 7 weighing pending lbs.  oz. Uterus, tubes and ovaries were all normal.  There were no other significant findings  Description of operation:  Patient was taken to the operating room and  placed in the sitting position where she underwent a spinal anesthetic. She was then placed in the supine position with tilt to the left side. When adequate anesthetic level was obtained she was prepped and draped in usual sterile fashion and a Foley catheter was placed. A Pfannenstiel skin incision was made and carried down sharply to the rectus fascia which was scored in the midline extended laterally. The fascia was taken off the muscles both superiorly and without difficulty. The muscles were divided.  The peritoneal cavity was entered.  Bladder blade was placed, no bladder flap was created.  A low transverse hysterotomy incision was made and delivered a viable female  infant at 46 with Apgars of 5 and 7 weighing lbs  oz.  Cord pH was obtained and was 6.96. The uterus was exteriorized. It was closed in 2 layers, the first being a running interlocking layer and the second being an imbricating layer using 0 monocryl on a CTX needle.   Interestingly the time I removed the placenta I could tell there was probably about a 40% amount of placenta that was not intact with the uterus.  It was sent to pathology for evaluation.  The anterior uterine wall had what I would call a Couvelaire appearance resulting in significant hypotonia of the anterior uterine wall.  Because of my concern for this I gave her intramyometrial Hemabate 1 mg total and the remainder of the uterus contracted down beautifully.  However the anterior uterine wall where the placenta was did not there remained purple dilated and hypertonic.  I then gave the patient Methergine IM and  opened up her fluid with Pitocin which resulted in improved anterior uterine wall tone.  However it was still suboptimal and I placed a large deep single suture at the top of the area of the Couvelaire appearance to decrease the pulse pressure to the area.  After viewing the area for some period of time I was satisfied that there was adequate amount of uterine time to keep her from having a postpartum hemorrhage which would be of uterine tone origin not incision of uterus our attention.  The uterus tubes and ovaries were all normal. Peritoneal cavity was irrigated vigorously. The muscles and peritoneum were reapproximated loosely. The fascia was closed using 0 Vicryl in running fashion. Subcutaneous tissue was made hemostatic and irrigated. The skin was closed using staples.   Blood loss for the procedure was 600 cc. The patient received 2 grams of Ancef prophylactically. The patient was taken to the recovery room in good stable condition with all counts being correct x3.  EBL 600 cc  EURE,LUTHER H 01/01/2016 2:30 PM

## 2016-01-01 NOTE — Consult Note (Signed)
[facility]  Delivery Note:  C-section       01/01/2016  12:58 PM  I was called to the operating room at the request of the patient's obstetrician (Dr. Despina HiddenEure) for a repeat c-section at 28 weeks due to non-reassuring fetal heart tones.  PRENATAL HX:  This is a 30 y/o G3P0212 at 7628 and 2/7 weeks.  Her pregnancy is complicated by neurofibromatosis and pre-eclampsia.  She was admitted on 1/13 for elevated BPs and absent umbilical diastolic flow.  On growth scan the infant was noted to have poor growth with growth percentile less than 5%.  There was also a mass noted behind the placenta though to be an intravillous thrombus, though this may have been a partial abruption.  She received BMZ on 1/13 and 1/14.  This morning, on assessment, the fetus began to have prolonged decelerations to the 80s, so a code C-section was called.    INTRAPARTUM HX:   Repeat c-section with AROM at delivery  DELIVERY:  Infant had poor tone at delivery, but was breathing spontaneously with HR > 100.  CPAP placed upon assessment for increased work of breathing and FiO2 quickly weaned to 21%.  APGARs 5 and 7.  Exam notable for mild retractions and low tone, but was otherwise within normal limits.  Admit to NICU for prematurity.    _____________________ Electronically Signed By: Maryan CharLindsey Chino Sardo, MD Neonatologist

## 2016-01-01 NOTE — Anesthesia Procedure Notes (Signed)
Procedure Name: Intubation Date/Time: 01/01/2016 12:31 PM Performed by: Kendricks Reap G Pre-anesthesia Checklist: Patient identified, Emergency Drugs available, Suction available, Patient being monitored and Timeout performed Patient Re-evaluated:Patient Re-evaluated prior to inductionOxygen Delivery Method: Circle system utilized Preoxygenation: Pre-oxygenation with 100% oxygen Intubation Type: IV induction, Rapid sequence and Cricoid Pressure applied Laryngoscope Size: Glidescope and 3 Grade View: Grade I Tube type: Oral Tube size: 7.0 mm Number of attempts: 1 Airway Equipment and Method: Video-laryngoscopy and Stylet Placement Confirmation: ETT inserted through vocal cords under direct vision Secured at: 21 cm Tube secured with: Tape Dental Injury: Bloody posterior oropharynx and Teeth and Oropharynx as per pre-operative assessment

## 2016-01-02 ENCOUNTER — Encounter (HOSPITAL_COMMUNITY): Payer: Self-pay | Admitting: *Deleted

## 2016-01-02 LAB — CBC
HCT: 25.3 % — ABNORMAL LOW (ref 36.0–46.0)
Hemoglobin: 8.4 g/dL — ABNORMAL LOW (ref 12.0–15.0)
MCH: 28.3 pg (ref 26.0–34.0)
MCHC: 33.2 g/dL (ref 30.0–36.0)
MCV: 85.2 fL (ref 78.0–100.0)
PLATELETS: 271 10*3/uL (ref 150–400)
RBC: 2.97 MIL/uL — ABNORMAL LOW (ref 3.87–5.11)
RDW: 15.3 % (ref 11.5–15.5)
WBC: 23.4 10*3/uL — ABNORMAL HIGH (ref 4.0–10.5)

## 2016-01-02 NOTE — Progress Notes (Signed)
I came to introduce myself to Karen Mckinney due to her baby's recent NICU admission and was notified by her nurse that [name]  I visited with the patient and her family to offer support.  She stated that she was doing okay, but did tear up a bit when I mentioned that it was hard enough to have a baby in the NICU, but even harder when it's at a different facility.  I asked Karen Mckinney if she would like me to get in contact with the Chaplain at [facility] and she stated that she is hoping to be discharged and able to [name] to be closer to the baby tomorrow.  She is aware of how to reach spiritual care services.  Please page as further needs arise.  Maryanna ShapeAmanda M. Carley Hammedavee Lomax, M.Div. [facility] Chaplain Pager [phone] Office [phone]      01/02/16 1557  Clinical Encounter Type  Visited With Patient and family together  Visit Type Initial;Social support  Referral From Chaplain  Consult/Referral To Chaplain

## 2016-01-02 NOTE — Anesthesia Postprocedure Evaluation (Signed)
Anesthesia Post Note  Patient: Mayra Reelatoyia Wiegand  Procedure(s) Performed: Procedure(s) (LRB): CESAREAN SECTION (N/A)  Patient location during evaluation: [facility] Anesthesia Type: General Level of consciousness: awake and alert Pain management: pain level controlled Vital Signs Assessment: post-procedure vital signs reviewed and stable Respiratory status: spontaneous breathing Cardiovascular status: stable Postop Assessment: no signs of nausea or vomiting and adequate PO intake Anesthetic complications: no    Last Vitals:  Filed Vitals:   01/02/16 0156 01/02/16 0600  BP: 143/67 152/85  Pulse: 105 100  Temp: 37.3 C 36.9 C  Resp: 18 20    Last Pain:  Filed Vitals:   01/02/16 0655  PainSc: 4                  Shantia Sanford Hristova

## 2016-01-02 NOTE — Lactation Note (Signed)
Lactation Consultation Note  Patient Name: Kharis Senger Today's Date: 01/02/2016  NICU baby 20 hours old at time of visit with mom. Met with mom to review pumping for EBM. Mom states that she is pumping every 2-3 hours and has collected EBM in one colostrum container which is in the refrigerator on the Women's Unit. Reviewed EBM storage guidelines. Enc mom to continue pumping at least 8 times/24 hours for 15 minutes followed by hand expression. Mom gave permission for this LC to send BF referral to [place] [facility] office, and it was faxed over.    Maternal Data    Feeding    LATCH Score/Interventions                      Lactation Tools Discussed/Used     Consult Status      WILLIARD, JENNIFER 01/02/2016, 1:26 PM    

## 2016-01-02 NOTE — Progress Notes (Signed)
Subjective: Postpartum Day 1: Cesarean Delivery Patient reports incisional pain and tolerating PO.    Objective: Vital signs in last 24 hours: Temp:  [98.1 F (36.7 C)-99.1 F (37.3 C)] 98.4 F (36.9 C) (01/16 0600) Pulse Rate:  [55-111] 100 (01/16 0600) Resp:  [9-24] 20 (01/16 0600) BP: (138-162)/(67-108) 152/85 mmHg (01/16 0600) SpO2:  [96 %-100 %] 100 % (01/16 0600)  Physical Exam:  General: alert, cooperative, appears stated age and no distress Lochia: appropriate Uterine Fundus: firm Incision: no significant drainage, no significant erythema DVT Evaluation: Negative Homan's sign. No cords or calf tenderness. No significant calf/ankle edema.   Recent Labs  12/30/15 1315 01/02/16 0515  HGB 12.5 8.4*  HCT 36.9 25.3*    Assessment/Plan: Status post Cesarean section. Doing well postoperatively.  Continue current care.  Wyvonnia DuskyLAWSON, Eureka Valdes DARLENE 01/02/2016, 7:29 AM

## 2016-01-02 NOTE — Addendum Note (Signed)
Addendum  created 01/02/16 0755 by Elgie CongoNataliya H Romone Shaff, CRNA   Modules edited: Clinical Notes   Clinical Notes:  File: [id number]

## 2016-01-02 NOTE — Progress Notes (Signed)
Subjective: Postpartum Day 1: Cesarean Delivery Patient reports incisional pain and tolerating PO.    Objective: Vital signs in last 24 hours: Temp:  [98.1 F (36.7 C)-99.1 F (37.3 C)] 98.4 F (36.9 C) (01/16 0600) Pulse Rate:  [64-111] 100 (01/16 0600) Resp:  [9-24] 20 (01/16 0600) BP: (138-162)/(67-108) 152/85 mmHg (01/16 0600) SpO2:  [96 %-100 %] 100 % (01/16 0600)  Physical Exam:  General: alert, cooperative and no distress Lochia: appropriate Uterine Fundus: firm Incision: clean dry intact DVT Evaluation: No evidence of DVT seen on physical exam.   Recent Labs  12/30/15 1315 01/02/16 0515  HGB 12.5 8.4*  HCT 36.9 25.3*  WBC 24K most probably secondary to betamethasone injections, recheck tomorrow  Assessment/Plan: Status post Cesarean section. Doing well postoperatively.  Continue current care Repeat CBC tomorrow.  EURE,LUTHER H 01/02/2016, 8:02 AM

## 2016-01-03 DIAGNOSIS — O1490 Unspecified pre-eclampsia, unspecified trimester: Secondary | ICD-10-CM

## 2016-01-03 LAB — CBC
HCT: 23.1 % — ABNORMAL LOW (ref 36.0–46.0)
HEMOGLOBIN: 7.7 g/dL — AB (ref 12.0–15.0)
MCH: 28.6 pg (ref 26.0–34.0)
MCHC: 33.3 g/dL (ref 30.0–36.0)
MCV: 85.9 fL (ref 78.0–100.0)
Platelets: 219 10*3/uL (ref 150–400)
RBC: 2.69 MIL/uL — AB (ref 3.87–5.11)
RDW: 15.4 % (ref 11.5–15.5)
WBC: 17.2 10*3/uL — ABNORMAL HIGH (ref 4.0–10.5)

## 2016-01-03 LAB — COMPREHENSIVE METABOLIC PANEL
ALK PHOS: 107 U/L (ref 38–126)
ALT: 14 U/L (ref 14–54)
AST: 21 U/L (ref 15–41)
Albumin: 2.8 g/dL — ABNORMAL LOW (ref 3.5–5.0)
Anion gap: 8 (ref 5–15)
BUN: 19 mg/dL (ref 6–20)
CALCIUM: 8.5 mg/dL — AB (ref 8.9–10.3)
CO2: 21 mmol/L — ABNORMAL LOW (ref 22–32)
CREATININE: 0.92 mg/dL (ref 0.44–1.00)
Chloride: 107 mmol/L (ref 101–111)
Glucose, Bld: 102 mg/dL — ABNORMAL HIGH (ref 65–99)
Potassium: 4.8 mmol/L (ref 3.5–5.1)
Sodium: 136 mmol/L (ref 135–145)
TOTAL PROTEIN: 6 g/dL — AB (ref 6.5–8.1)
Total Bilirubin: 0.4 mg/dL (ref 0.3–1.2)

## 2016-01-03 MED ORDER — FERROUS SULFATE 325 (65 FE) MG PO TABS: 325.0000 mg | ORAL_TABLET | Freq: Two times a day (BID) | ORAL | Status: DC

## 2016-01-03 MED ORDER — SENNOSIDES-DOCUSATE SODIUM 8.6-50 MG PO TABS: 1.0000 | ORAL_TABLET | Freq: Every evening | ORAL | Status: AC | PRN

## 2016-01-03 MED ORDER — OXYCODONE-ACETAMINOPHEN 5-325 MG PO TABS: 1.0000 | ORAL_TABLET | ORAL | Status: DC | PRN

## 2016-01-03 NOTE — Plan of Care (Signed)
Problem: Skin Integrity: Goal: Demonstration of wound healing without infection will improve Outcome: Completed/Met Date Met:  01/03/16 Pt has a skin disorder  And knows what to do  Instructed on when to remove dressing and how

## 2016-01-03 NOTE — Clinical Social Work Maternal (Addendum)
CLINICAL SOCIAL WORK MATERNAL/CHILD NOTE  Patient Details  Name: Karen Mckinney MRN: [id number] Date of Birth: 11-Nov-1986  Date:  01/03/2016  Clinical Social Worker Initiating Note:  Mahesh Sizemore E. Brigitte Pulse, [place] Date/ Time Initiated:  01/02/16/1400     Child's Name:  Karen Mckinney   Legal Guardian:  Mother   Need for Interpreter:  None   Date of Referral:        Reason for Referral:   (No referral-NICU admission/transfer)   Referral Source:      Address:  [address] [name], [place] [address]  Phone number:  [phone]   Household Members:  Minor Children (MOB has one other child, Investment banker, operational, age 38)   Natural Supports (not living in the home):  Extended Family (MOB states that her aunt is her support person)   Medical illustrator Supports:     Employment:     Type of Work:  (Unemployed)   Education:      Museum/gallery curator Resources:  Medicaid, SSI/Disability   Other Resources:      Cultural/Religious Considerations Which May Impact Care: None stated.  Strengths:  Understanding of illness, Compliance with medical plan  (Did not discuss all of these aspects of MOB's situation given the need to talk abotu baby's transfer.)   Risk Factors/Current Problems:  None   Cognitive State:  Alert , Linear Thinking , Goal Oriented    Mood/Affect:  Calm , Relaxed , Interested    CSW Assessment: CSW met with MOB in her third floor room/305 to introduce services and offer support to MOB whose baby was born at 28.2 weeks yesterday and transferred to [facility] today.  MOB was very quiet, and dividing her meal between her and her daughter in the room.  CSW offered a meal voucher so that they both had enough food to eat.  MOB was appreciative.   CSW inquired about baby to understand MOB's perspective and understanding of the situation.  She reports that baby was transferred due to air in her stomach.  CSW asked if she felt comfortable sharing how she is feeling regarding  baby's premature delivery and transfer to another facility.  MOB states she thinks she is coping ok, but gets tearful when she thinks about it too much.  CSW validated feelings of sadness.  CSW notes that MOB has a history of Anxiety and Depression noted in her medical record, but did not address this specifically because her 23 year old daughter was present.  Instead, CSW spoke about the importance of mental health care and encouraged her to call her doctor or speak to a Education officer, museum at [facility] if she is feeling she needs added support.  CSW also offered for her to call NICU CSW if she has concerns at any time.   MOB reports that she would like to stay at the [facility] in [place].  MOB completed referral form and obtained MOB's signature on authorization for the organization to complete a background check.  MOB reports that her daughter will be able to stay with her aunt for as long as needed so that MOB can be with baby in [place].   CSW informed MOB of baby's eligibility for Supplemental Security Income (SSI) due to baby's gestational age and birth weight and instructed her to apply through the [employer] if she is interested. MOB seemed appreciative of the support and assistance offered to her by NICU CSW.  CSW provided contact information and asked her to call  if needed.  MOB states no further questions, concerns or needs at this time.  CSW identifies no barriers to discharge when MOB is medically ready.  CSW Plan/Description:  Information/Referral to [employer] , [employer] , No Further Intervention Required/No Barriers to Discharge    Alphonzo Cruise, [place] 01/03/2016, 8:41 AM

## 2016-01-03 NOTE — Progress Notes (Signed)
Ambulated out with pt

## 2016-01-03 NOTE — Clinical Documentation Improvement (Signed)
OB/GYN  Abnormal Lab/Test Results:  01/03/16: H/H= 7.7/23.1  Possible Clinical Conditions associated with below indicators  Acute Blood Loss Anemia  Chronic Anemia  Acute on Chronic Anemia  Other Condition  Cannot Clinically Determine   Supporting Information:  12/30/15: H/H= 12.5/36.9 01/01/16: C-section; EBL= 600 cc   Please exercise your independent, professional judgment when responding. A specific answer is not anticipated or expected.   Thank You,  Cherylann Ratel, RN, BSN Health Information Management [place] [phone]

## 2016-01-03 NOTE — Discharge Instructions (Signed)
Preeclampsia and Eclampsia °Preeclampsia is a serious condition that develops only during pregnancy. It is also called toxemia of pregnancy. This condition causes high blood pressure along with other symptoms, such as swelling and headaches. These may develop as the condition gets worse. Preeclampsia may occur 20 weeks or later into your pregnancy.  °Diagnosing and treating preeclampsia early is very important. If not treated early, it can cause serious problems for you and your baby. One problem it can lead to is eclampsia, which is a condition that causes muscle jerking or shaking (convulsions) in the mother. Delivering your baby is the best treatment for preeclampsia or eclampsia.  °RISK FACTORS °The cause of preeclampsia is not known. You may be more likely to develop preeclampsia if you have certain risk factors. These include:  °· Being pregnant for the first time. °· Having preeclampsia in a past pregnancy. °· Having a family history of preeclampsia. °· Having high blood pressure. °· Being pregnant with twins or triplets. °· Being 35 or older. °· Being African American. °· Having kidney disease or diabetes. °· Having medical conditions such as lupus or blood diseases. °· Being very overweight (obese). °SIGNS AND SYMPTOMS  °The earliest signs of preeclampsia are: °· High blood pressure. °· Increased protein in your urine. Your health care provider will check for this at every prenatal visit. °Other symptoms that can develop include:  °· Severe headaches. °· Sudden weight gain. °· Swelling of your hands, face, legs, and feet. °· Feeling sick to your stomach (nauseous) and throwing up (vomiting). °· Vision problems (blurred or double vision). °· Numbness in your face, arms, legs, and feet. °· Dizziness. °· Slurred speech. °· Sensitivity to bright lights. °· Abdominal pain. °DIAGNOSIS  °There are no screening tests for preeclampsia. Your health care provider will ask you about symptoms and check for signs of  preeclampsia during your prenatal visits. You may also have tests, including: °· Urine testing. °· Blood testing. °· Checking your baby's heart rate. °· Checking the health of your baby and your placenta using images created with sound waves (ultrasound). °TREATMENT  °You can work out the best treatment approach together with your health care provider. It is very important to keep all prenatal appointments. If you have an increased risk of preeclampsia, you may need more frequent prenatal exams. °· Your health care provider may prescribe bed rest. °· You may have to eat as little salt as possible. °· You may need to take medicine to lower your blood pressure if the condition does not respond to more conservative measures. °· You may need to stay in the hospital if your condition is severe. There, treatment will focus on controlling your blood pressure and fluid retention. You may also need to take medicine to prevent seizures. °· If the condition gets worse, your baby may need to be delivered early to protect you and the baby. You may have your labor started with medicine (be induced), or you may have a cesarean delivery. °· Preeclampsia usually goes away after the baby is born. °HOME CARE INSTRUCTIONS  °· Only take over-the-counter or prescription medicines as directed by your health care provider. °· Lie on your left side while resting. This keeps pressure off your baby. °· Elevate your feet while resting. °· Get regular exercise. Ask your health care provider what type of exercise is safe for you. °· Avoid caffeine and alcohol. °· Do not smoke. °· Drink 6-8 glasses of water every day. °· Eat a balanced diet   that is low in salt. Do not add salt to your food.  Avoid stressful situations as much as possible.  Get plenty of rest and sleep.  Keep all prenatal appointments and tests as scheduled. SEEK MEDICAL CARE IF:  You are gaining more weight than expected.  You have any headaches, abdominal pain, or  nausea.  You are bruising more than usual.  You feel dizzy or light-headed. SEEK IMMEDIATE MEDICAL CARE IF:   You develop sudden or severe swelling anywhere in your body. This usually happens in the legs.  You gain 5 lb (2.3 kg) or more in a week.  You have a severe headache, dizziness, problems with your vision, or confusion.  You have severe abdominal pain.  You have lasting nausea or vomiting.  You have a seizure.  You have trouble moving any part of your body.  You develop numbness in your body.  You have trouble speaking.  You have any abnormal bleeding.  You develop a stiff neck.  You pass out. MAKE SURE YOU:   Understand these instructions.  Will watch your condition.  Will get help right away if you are not doing well or get worse.   This information is not intended to replace advice given to you by your health care provider. Make sure you discuss any questions you have with your health care provider.   Document Released: 11/30/2000 Document Revised: 12/08/2013 Document Reviewed: 09/25/2013 Elsevier Interactive Patient Education 2016 [employer] Cesarean Delivery, Care After Refer to this sheet in the next few weeks. These instructions provide you with information on caring for yourself after your procedure. Your health care provider may also give you specific instructions. Your treatment has been planned according to current medical practices, but problems sometimes occur. Call your health care provider if you have any problems or questions after you go home. HOME CARE INSTRUCTIONS  Only take over-the-counter or prescription medications as directed by your health care provider.  Do not drink alcohol, especially if you are breastfeeding or taking medication to relieve pain.  Do not chew or smoke tobacco.  Continue to use good perineal care. Good perineal care includes:  Wiping your perineum from front to back.  Keeping your perineum clean.  Check  your surgical cut (incision) daily for increased redness, drainage, swelling, or separation of skin.  Clean your incision gently with soap and water every day, and then pat it dry. If your health care provider says it is okay, leave the incision uncovered. Use a bandage (dressing) if the incision is draining fluid or appears irritated. If the adhesive strips across the incision do not fall off within 7 days, carefully peel them off.  Hug a pillow when coughing or sneezing until your incision is healed. This helps to relieve pain.  Do not use tampons or douche until your health care provider says it is okay.  Shower, wash your hair, and take tub baths as directed by your health care provider.  Wear a well-fitting bra that provides breast support.  Limit wearing support panties or control-top hose.  Drink enough fluids to keep your urine clear or pale yellow.  Eat high-fiber foods such as whole grain cereals and breads, brown rice, beans, and fresh fruits and vegetables every day. These foods may help prevent or relieve constipation.  Resume activities such as climbing stairs, driving, lifting, exercising, or traveling as directed by your health care provider.  Talk to your health care provider about resuming sexual activities. This is dependent  upon your risk of infection, your rate of healing, and your comfort and desire to resume sexual activity.  Try to have someone help you with your household activities and your newborn for at least a few days after you leave the hospital.  Rest as much as possible. Try to rest or take a nap when your newborn is sleeping.  Increase your activities gradually.  Keep all of your scheduled postpartum appointments. It is very important to keep your scheduled follow-up appointments. At these appointments, your health care provider will be checking to make sure that you are healing physically and emotionally. SEEK MEDICAL CARE IF:   You are passing large  clots from your vagina. Save any clots to show your health care provider.  You have a foul smelling discharge from your vagina.  You have trouble urinating.  You are urinating frequently.  You have pain when you urinate.  You have a change in your bowel movements.  You have increasing redness, pain, or swelling near your incision.  You have pus draining from your incision.  Your incision is separating.  You have painful, hard, or reddened breasts.  You have a severe headache.  You have blurred vision or see spots.  You feel sad or depressed.  You have thoughts of hurting yourself or your newborn.  You have questions about your care, the care of your newborn, or medications.  You are dizzy or light-headed.  You have a rash.  You have pain, redness, or swelling at the site of the removed intravenous access (IV) tube.  You have nausea or vomiting.  You stopped breastfeeding and have not had a menstrual period within 12 weeks of stopping.  You are not breastfeeding and have not had a menstrual period within 12 weeks of delivery.  You have a fever. SEEK IMMEDIATE MEDICAL CARE IF:  You have persistent pain.  You have chest pain.  You have shortness of breath.  You faint.  You have leg pain.  You have stomach pain.  Your vaginal bleeding saturates 2 or more sanitary pads in 1 hour. MAKE SURE YOU:   Understand these instructions.  Will watch your condition.  Will get help right away if you are not doing well or get worse.   This information is not intended to replace advice given to you by your health care provider. Make sure you discuss any questions you have with your health care provider.   Document Released: 08/25/2002 Document Revised: 12/24/2014 Document Reviewed: 07/30/2012 Elsevier Interactive Patient Education [employer].

## 2016-01-03 NOTE — Progress Notes (Signed)
Baby love NURSE  Called and pt will go to [facility] to get  Pump  Plan to  Go to [place]  To [facility]  To be with her baby

## 2016-01-03 NOTE — Lactation Note (Signed)
Lactation Consultation Note  Patient Name: Lillianah Swartzentruber UEAVW'U Date: 01/03/2016   Baby was transferred to [facility]. Mom states that [facility] is providing her with a DEBP. Mom given additional bottles for EBM storage. Mom aware of LC phone line assistance after D/C.  Maternal Data    Feeding    [facility] Score/Interventions                      Lactation Tools Discussed/Used     Consult Status      Geralynn Ochs 01/03/2016, 1:06 PM

## 2016-01-03 NOTE — Discharge Summary (Signed)
OB Discharge Summary     Patient Name: Karen Mckinney DOB: [birth year] MRN: [id number]  Date of admission: 12/30/2015 Delivering MD: Duane Lope H   Date of discharge: 01/03/2016  Admitting diagnosis: PREG Intrauterine pregnancy: [redacted]w[redacted]d     Secondary diagnosis:  Active Problems:   IUGR (intrauterine growth restriction)   Elevated BP   Abnormal placenta, antepartum   S/P emergency cesarean hysterectomy   Preeclampsia  Additional problems: anemia     Discharge diagnosis: preterm pregnancy, delivered; anemia; preeclampsia without severe features                                                                                   Post partum procedures:none  Augmentation: none  Complications:   Hospital course:  On day of admission fetal growth restriction diagnosed with abnormal doppler flow. Significant fetal heart rate decelerations also noted, and decision made to proceed with STAT c/s. See op note for more details.  Patient developed preeclampsia without severe features during her hospital stay. No antihypertensives or magnesium. BP the 24 hours prior to d/c wnl or mildly elevated. Will arrange for nurse home visit for BP check.   Hemaglobin trended from 12.4 to 7.7. Patient not symptomatic. Received 24 hours postpartum methergine. EBL recorded at 800 ml. Will be prescribed ferrous sulfate and given anemia return precautions.  Infant developed bowel perforation while in our NICU and was transferred to another facility for a higher level of care.  Physical exam  Filed Vitals:   01/02/16 1800 01/02/16 2159 01/03/16 0503 01/03/16 1200  BP: 146/86 124/68 141/75 135/74  Pulse: 110 112 97 101  Temp: 98.5 F (36.9 C) 98.7 F (37.1 C) 98.3 F (36.8 C) 98.4 F (36.9 C)  TempSrc: Oral Oral Oral Oral  Resp: Height:      Weight:      SpO2:  100% 99% 97%   General: alert, cooperative and no distress Lochia: appropriate Uterine Fundus: firm Incision: Healing well  with no significant drainage, No significant erythema, Dressing is clean, dry, and intact DVT Evaluation: No cords or calf tenderness. No significant calf/ankle edema. Labs: Lab Results  Component Value Date   WBC 17.2* 01/03/2016   HGB 7.7* 01/03/2016   HCT 23.1* 01/03/2016   MCV 85.9 01/03/2016   PLT 219 01/03/2016   CMP Latest Ref Rng 01/03/2016  Glucose 65 - 99 mg/dL 409(W)  BUN 6 - 20 mg/dL 19  Creatinine 1.19 - 1.47 mg/dL 8.29  Sodium 562 - 130 mmol/L 136  Potassium 3.5 - 5.1 mmol/L 4.8  Chloride 101 - 111 mmol/L 107  CO2 22 - 32 mmol/L 21(L)  Calcium 8.9 - 10.3 mg/dL 8.6(V)  Total Protein 6.5 - 8.1 g/dL 6.0(L)  Total Bilirubin 0.3 - 1.2 mg/dL 0.4  Alkaline Phos 38 - 126 U/L 107  AST 15 - 41 U/L 21  ALT 14 - 54 U/L 14    Discharge instruction: per After Visit Summary and "Baby and Me Booklet".  After visit meds:    Medication List    STOP taking these medications        aspirin EC 81 MG tablet  TAKE these medications        acetaminophen 500 MG tablet  Commonly known as:  TYLENOL  Take 1 tablet (500 mg total) by mouth every 6 (six) hours as needed.     ferrous sulfate 325 (65 FE) MG tablet  Commonly known as:  FERROUSUL  Take 1 tablet (325 mg total) by mouth 2 (two) times daily.     oxyCODONE-acetaminophen 5-325 MG tablet  Commonly known as:  PERCOCET/ROXICET  Take 1-2 tablets by mouth every 4 (four) hours as needed (pain scale 4-7).     prenatal vitamin w/FE, FA 27-1 MG Tabs tablet  Take 1 tablet by mouth daily.     senna-docusate 8.6-50 MG tablet  Commonly known as:  Senokot-S  Take 1 tablet by mouth at bedtime as needed for mild constipation.        Diet: routine diet  Activity: Advance as tolerated. Pelvic rest for 6 weeks.   Outpatient follow up:6 weeks (BP check by home visitation RN this week) Follow up Appt:Future Appointments Date Time Provider Department Center  02/08/2016 1:15 PM Allie Bossier, MD [facility]   Postpartum  contraception: Undecided  Newborn Data: Live born female  Birth Weight: 1 lb 14.3 oz (860 g) APGAR: 5, 7  Baby Feeding: Bottle Disposition:NICU(transferred to other facility)   01/03/2016 Silvano Bilis, MD

## 2016-01-11 ENCOUNTER — Telehealth: Payer: Self-pay | Admitting: *Deleted

## 2016-01-11 NOTE — Telephone Encounter (Signed)
Pt called and stated that she has staples in and needs them removed. She is currently staying at [facility] in [place]. She states that she is unable to come to clinic until 01/18/16. I spoke with Dr. Derwood Kaplan about patient and she strongly urges patient to come in asap to have staples removed. Patient states that she will try to work it out. She will call back and let front desk know.

## 2016-01-12 ENCOUNTER — Ambulatory Visit: Payer: Medicaid Other

## 2016-01-12 ENCOUNTER — Encounter: Payer: Medicaid Other | Admitting: Family Medicine

## 2016-01-12 DIAGNOSIS — Z4802 Encounter for removal of sutures: Secondary | ICD-10-CM

## 2016-01-12 NOTE — Progress Notes (Signed)
Pt presents today for staples removal of her incision from c-section.  Her area looks nice and clean. Pt will follow up in 3 weeks for postpartum check up.

## 2016-01-23 ENCOUNTER — Telehealth: Payer: Self-pay | Admitting: General Practice

## 2016-01-23 NOTE — Telephone Encounter (Signed)
Patient called and left message stating she was scheduled for a BP check last week but missed the appt because she was with her daughter at [facility]. Patient states they checked her blood pressure and it was 128/70. Per Dr Adrian Blackwater, patient does not need follow- can be seen at postpartum visit on 2/22. Called patient stating I am returning her phone call & asked if she has been having headaches, dizziness or blurry vision. Patient denies. Told patient we can see her for her postpartum visit on 2/22- she does not need to be seen before then. Patient verbalized understanding & had no questions

## 2016-02-08 ENCOUNTER — Encounter: Payer: Self-pay | Admitting: Obstetrics & Gynecology

## 2016-02-08 ENCOUNTER — Ambulatory Visit (INDEPENDENT_AMBULATORY_CARE_PROVIDER_SITE_OTHER): Payer: Medicaid Other | Admitting: Obstetrics & Gynecology

## 2016-02-08 NOTE — Progress Notes (Signed)
Subjective:     Karen Mckinney is a 30 y.o. S AAP2 (51 yo daughter and 38 week old daughter)  female who presents for a postpartum visit. She is 5 weeks postpartum following a C/S delivery w/vertical incision due to fetal heart rate decelerations. I have fully reviewed the prenatal and intrapartum course. The delivery was at 28.2 gestational weeks. Outcome: viable female still in NICU - progressing well . Anesthesia: general. Postpartum course has been uncomplicated. Baby's course has been in NICU. Baby is feeding by gavage feeding with donor breast milk. Bleeding none. Bowel function is normal.  Bladder function is normal. Patient is not sexually active. Contraception method is desires tubal sterilization and needs short term contraception now.  Postpartum depression screening: negative. .  The following portions of the patient's history were reviewed and updated as appropriate: allergies, current medications, past family history, past medical history, past social history, past surgical history and problem list.  Review of Systems Pertinent items are noted in HPI.   Objective:    BP 136/77 mmHg  Pulse 85  Temp(Src) 97.5 F (36.4 C) (Oral)  Resp 20  Ht  (1.676 m)  Wt 216 lb 9.6 oz (98.249 kg)  BMI 34.98 kg/m2  LMP 02/03/2016  Breastfeeding? No  General:  alert   Breasts:  inspection negative, no nipple discharge or bleeding, no masses or nodularity palpable  Lungs: clear to auscultation bilaterally  Heart:  regular rate and rhythm, S1, S2 normal, no murmur, click, rub or gallop  Abdomen: soft, non-tender; bowel sounds normal; no masses,  no organomegaly   Vulva:  not examined  Vagina: not evaluated  Cervix:  normal  Corpus: not examined  Adnexa:  not evaluated  Rectal Exam: Not performed.        Assessment:   Normal postpartum exam. Pap smear not done at today's visit.  She reports a normal pap smear at the health dept within the last 2 years.  Plan:   Contraception:  abstinence She is certain that she doesn't want more kids Depo provera today. Back up method for 2 weeks.

## 2016-02-10 ENCOUNTER — Encounter (HOSPITAL_COMMUNITY): Payer: Self-pay | Admitting: *Deleted

## 2016-02-27 IMAGING — US US OB LIMITED
1 series · 14 of 19 positions shown · non-contrast
Comparison: none

CLINICAL DATA: Acute onset of sharp pelvic pain. Initial encounter.

EXAM:
LIMITED OBSTETRIC ULTRASOUND

[Series 1: us ob limited · 0.17mm/px · 14 of 19 slices shown]
[im 1/19]
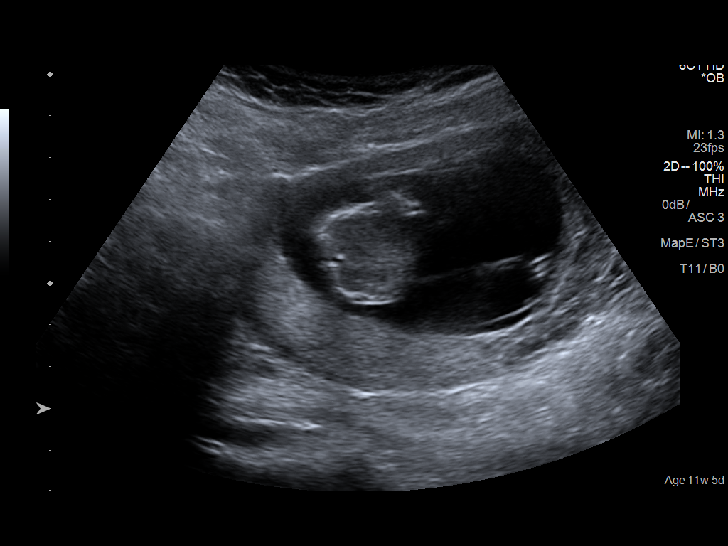
[im 3/19]
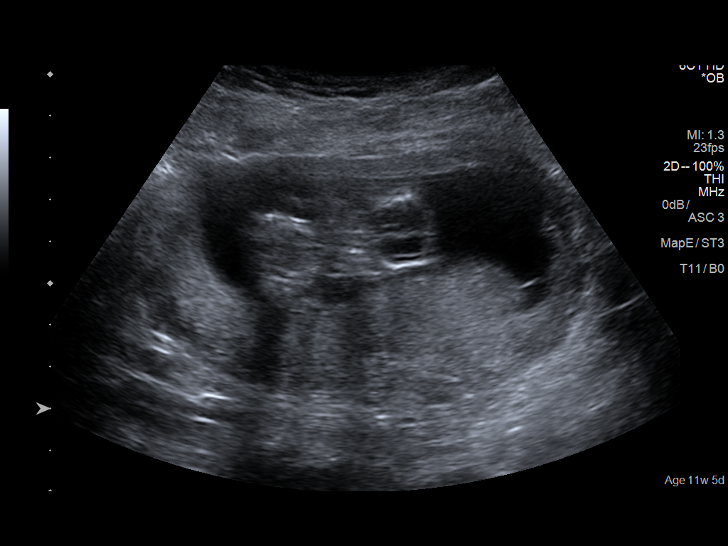
[im 4/19]
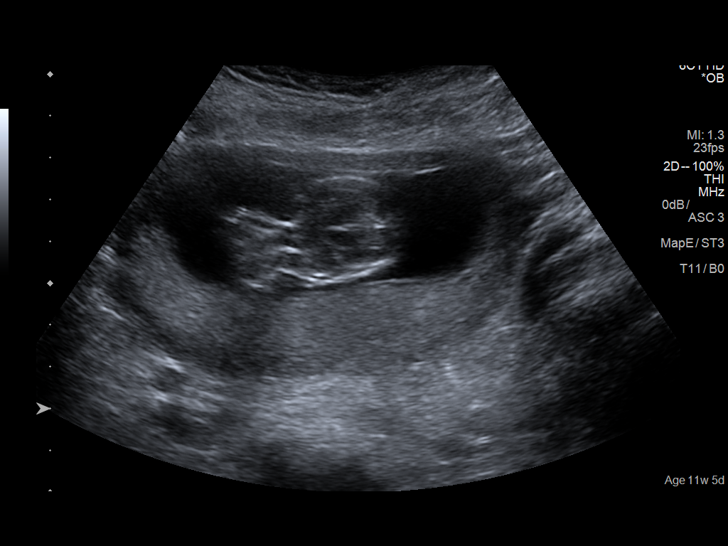
[im 5/19]
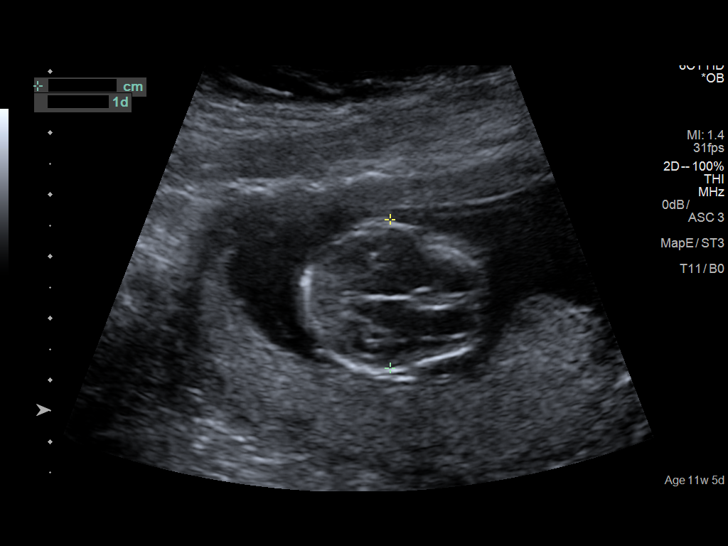
[im 7/19]
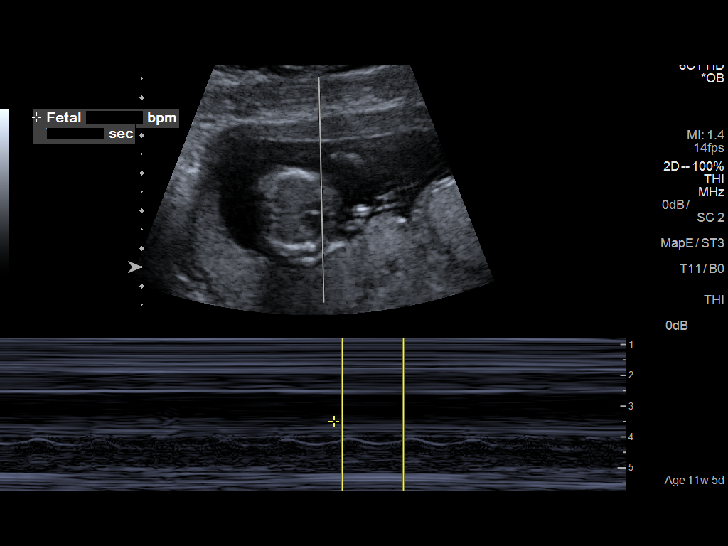
[im 8/19]
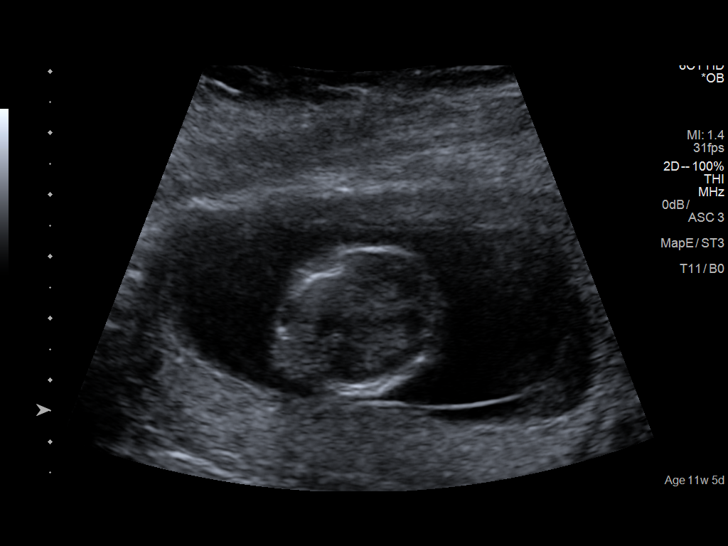
[im 9/19]
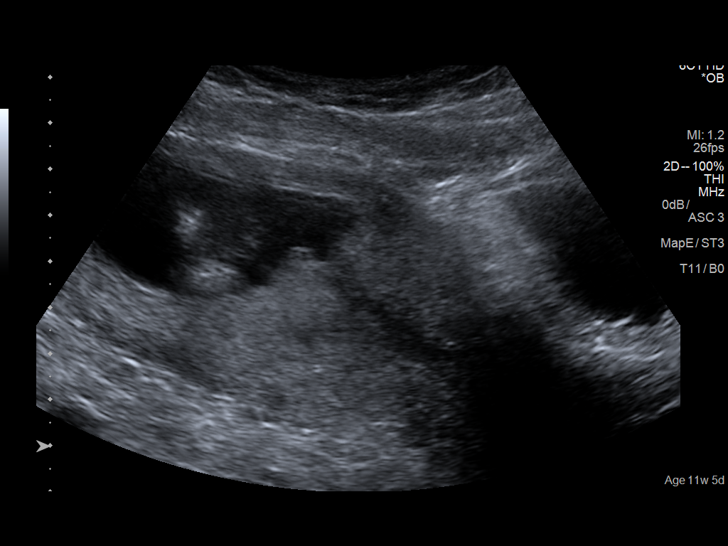
[im 11/19]
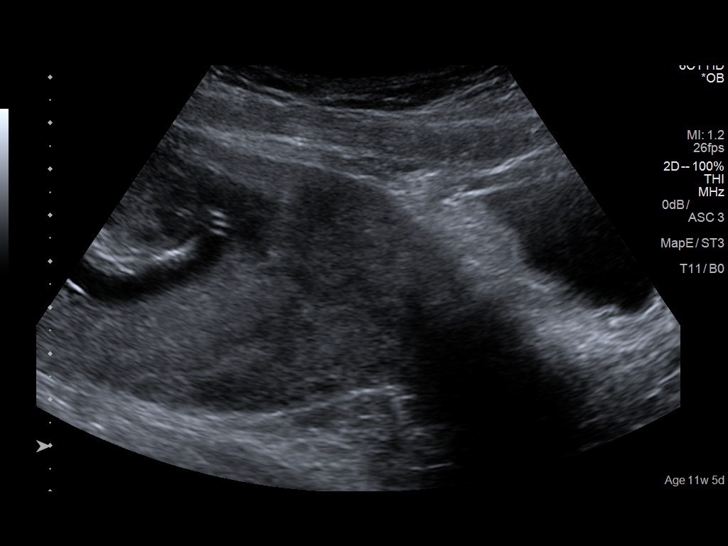
[im 12/19]
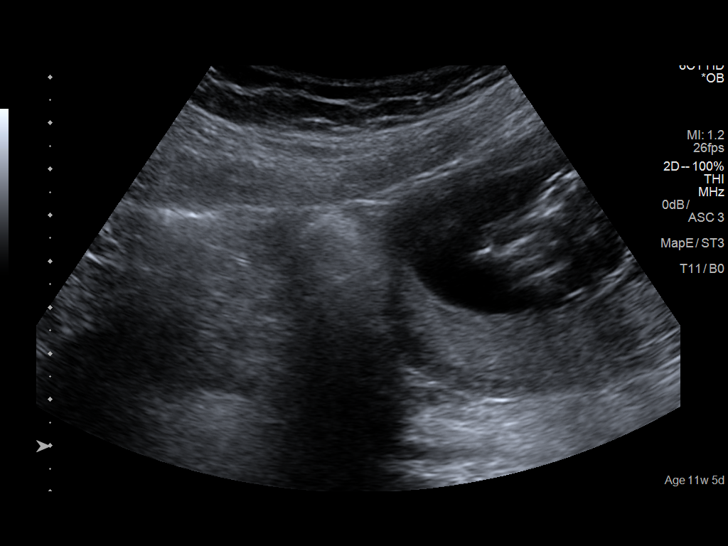
[im 13/19]
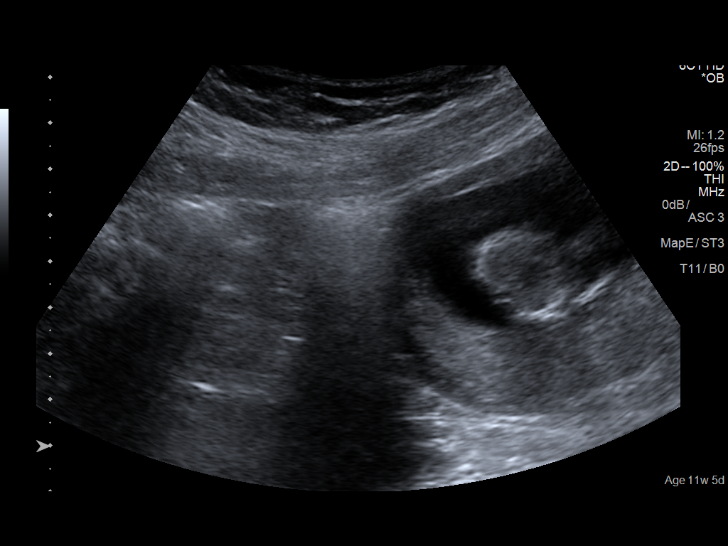
[im 15/19]
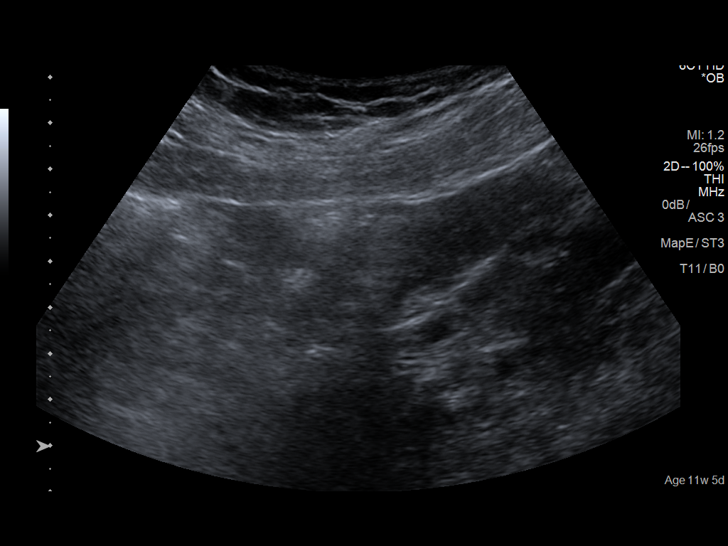
[im 16/19]
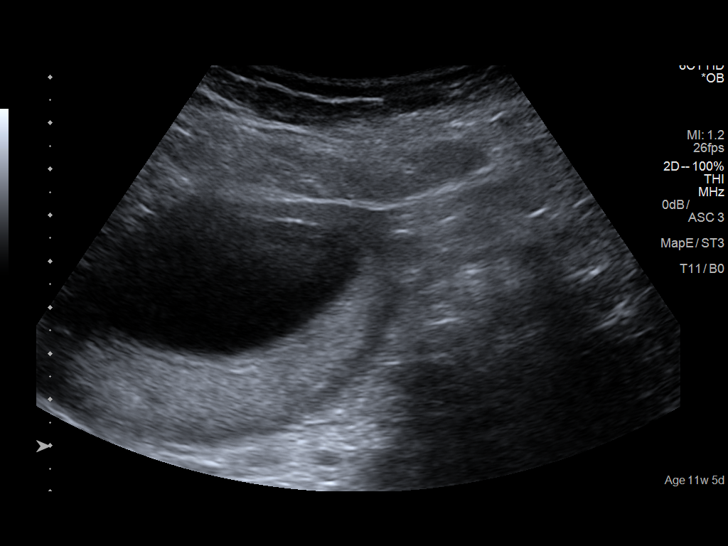
[im 17/19]
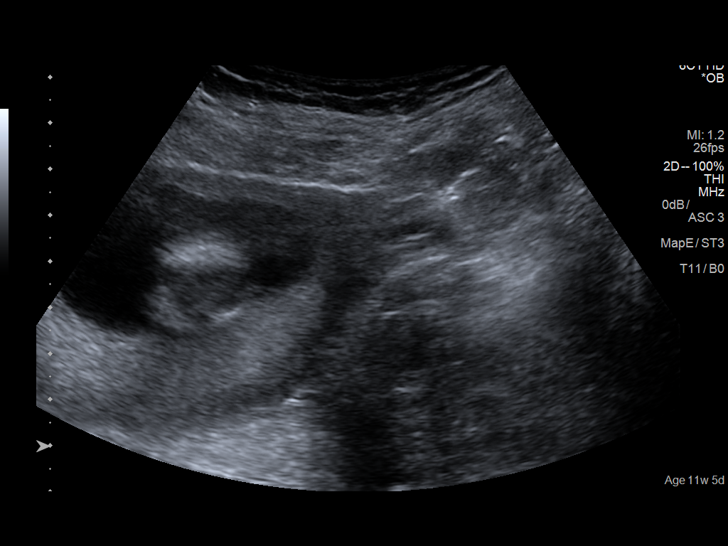
[im 19/19]
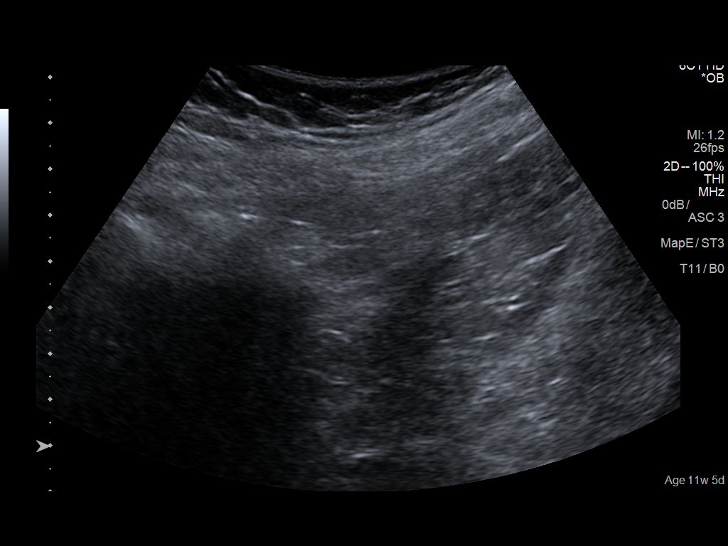

[14 of 19 positions shown; findings below may reference images not displayed]

FINDINGS: Number of Fetuses: 1

Heart Rate:  168 bpm

Movement: Yes

Presentation: Variable

Placental Location: Posterior

Previa: Marginal; still within normal limits given the gestational
age of 14 weeks 1 day.

Amniotic Fluid (Subjective):  Within normal limits.

BPD:  2.43 cm      14 w 1 d

MATERNAL FINDINGS:

Cervix:  Appears closed.

Uterus/Adnexae:  No abnormality visualized.
IMPRESSION: Single live intrauterine pregnancy noted, with a biparietal diameter
of 2.4 cm, corresponding to a gestational age of 14 weeks 1 day.
This does not match the gestational age by LMP, and reflects an
estimated date of delivery March 18, 2016. Marginal placenta
previa may resolve as time progresses, given the current gestational
age. The cervix remains closed.

This exam is performed on an emergent basis and does not
comprehensively evaluate fetal size, dating, or anatomy; follow-up
complete OB US should be considered if further fetal assessment is
warranted.

## 2016-02-29 ENCOUNTER — Encounter (HOSPITAL_COMMUNITY): Payer: Self-pay

## 2016-02-29 NOTE — Anesthesia Preprocedure Evaluation (Addendum)
Anesthesia Evaluation  Patient identified by MRN, date of birth, ID band Patient awake    Reviewed: Allergy & Precautions, H&P , NPO status , Patient's Chart, lab work & pertinent test results, reviewed documented beta blocker date and time   Airway Mallampati: II  TM Distance: >3 FB Neck ROM: full    Dental  (+) Teeth Intact   Pulmonary neg pulmonary ROS, former smoker,    Pulmonary exam normal        Cardiovascular Normal cardiovascular exam     Neuro/Psych    GI/Hepatic negative GI ROS, Neg liver ROS,   Endo/Other  negative endocrine ROS  Renal/GU      Musculoskeletal   Abdominal Normal abdominal exam  (+)   Peds  Hematology negative hematology ROS (+)   Anesthesia Other Findings Discussed case with preop nursing and see no reason she should come in early for anesthesia evaluation.. She has Neurofibromatosis with slight head MRI changes but no major lesions noted, does have migraine headaches managed on topomax that she see's neurology for, has had no problems with previous airway management x2 in past, most recently with stat GA for C section in January 2017.  Reproductive/Obstetrics negative OB ROS                             Anesthesia Physical Anesthesia Plan  ASA: II  Anesthesia Plan: General   Post-op Pain Management:    Induction: Intravenous  Airway Management Planned: Oral ETT  Additional Equipment:   Intra-op Plan:   Post-operative Plan: Extubation in OR  Informed Consent: I have reviewed the patients History and Physical, chart, labs and discussed the procedure including the risks, benefits and alternatives for the proposed anesthesia with the patient or authorized representative who has indicated his/her understanding and acceptance.   Dental Advisory Given  Plan Discussed with: CRNA and Surgeon  Anesthesia Plan Comments:         Anesthesia Quick  Evaluation                                  Anesthesia Evaluation  Preop documentation limited or incomplete due to emergent nature of procedure.  Airway Mallampati: II  TM Distance: >3 FB Neck ROM: Full    Dental no notable dental hx.    Pulmonary Current Smoker,    Pulmonary exam normal breath sounds clear to auscultation       Cardiovascular Normal cardiovascular exam Rhythm:Regular Rate:Normal     Neuro/Psych neurofibromatosis    GI/Hepatic   Endo/Other    Renal/GU      Musculoskeletal   Abdominal   Peds  Hematology   Anesthesia Other Findings   Reproductive/Obstetrics                            Anesthesia Physical Anesthesia Plan  ASA: II and emergent  Anesthesia Plan: General   Post-op Pain Management:    Induction: Intravenous, Rapid sequence and Cricoid pressure planned  Airway Management Planned: Oral ETT and Video Laryngoscope Planned  Additional Equipment:   Intra-op Plan:   Post-operative Plan: Extubation in OR  Informed Consent: I have reviewed the patients History and Physical, chart, labs and discussed the procedure including the risks, benefits and alternatives for the proposed anesthesia with the patient or authorized representative who has indicated his/her understanding and acceptance.  Dental advisory given  Plan Discussed with: CRNA  Anesthesia Plan Comments: (STAT C/S. Surgeon requests GA)       Anesthesia Quick Evaluation

## 2016-02-29 NOTE — Progress Notes (Signed)
Reviewed this patient with Dr. Mable FillB. Judd.  Patient was cleared to be a lab same day patient no need for preop appointment at this time.

## 2016-03-08 ENCOUNTER — Ambulatory Visit (HOSPITAL_COMMUNITY)
Admission: RE | Admit: 2016-03-08 | Discharge: 2016-03-08 | Disposition: A | Discharge status: Home / Self Care | Payer: Medicaid Other | Source: Ambulatory Visit | Attending: Obstetrics & Gynecology | Admitting: Obstetrics & Gynecology

## 2016-03-08 ENCOUNTER — Encounter (HOSPITAL_COMMUNITY)
Admission: RE | Disposition: A | Discharge status: Home / Self Care | Payer: Self-pay | Source: Ambulatory Visit | Attending: Obstetrics & Gynecology

## 2016-03-08 ENCOUNTER — Ambulatory Visit (HOSPITAL_COMMUNITY): Payer: Medicaid Other | Admitting: Anesthesiology

## 2016-03-08 ENCOUNTER — Encounter (HOSPITAL_COMMUNITY): Payer: Self-pay

## 2016-03-08 DIAGNOSIS — Z87891 Personal history of nicotine dependence: Secondary | ICD-10-CM | POA: Insufficient documentation

## 2016-03-08 DIAGNOSIS — Z302 Encounter for sterilization: Secondary | ICD-10-CM | POA: Diagnosis present

## 2016-03-08 DIAGNOSIS — Q8500 Neurofibromatosis, unspecified: Secondary | ICD-10-CM | POA: Insufficient documentation

## 2016-03-08 HISTORY — PX: LAPAROSCOPIC TUBAL LIGATION: SHX1937

## 2016-03-08 LAB — CBC
HCT: 35.7 % — ABNORMAL LOW (ref 36.0–46.0)
Hemoglobin: 11.1 g/dL — ABNORMAL LOW (ref 12.0–15.0)
MCH: 24.3 pg — ABNORMAL LOW (ref 26.0–34.0)
MCHC: 31.1 g/dL (ref 30.0–36.0)
MCV: 78.1 fL (ref 78.0–100.0)
PLATELETS: 459 10*3/uL — AB (ref 150–400)
RBC: 4.57 MIL/uL (ref 3.87–5.11)
RDW: 17 % — AB (ref 11.5–15.5)
WBC: 6.3 10*3/uL (ref 4.0–10.5)

## 2016-03-08 LAB — PREGNANCY, URINE: Preg Test, Ur: NEGATIVE

## 2016-03-08 SURGERY — LIGATION, FALLOPIAN TUBE, LAPAROSCOPIC
Anesthesia: General | Site: Abdomen | Laterality: Bilateral

## 2016-03-08 MED ORDER — PROPOFOL 10 MG/ML IV BOLUS
INTRAVENOUS | Status: DC | PRN
  Administered 2016-03-08: 200 mg via INTRAVENOUS

## 2016-03-08 MED ORDER — DEXAMETHASONE SODIUM PHOSPHATE 4 MG/ML IJ SOLN
INTRAMUSCULAR | Status: AC
  Filled 2016-03-08: qty 1

## 2016-03-08 MED ORDER — SCOPOLAMINE 1 MG/3DAYS TD PT72
MEDICATED_PATCH | TRANSDERMAL | Status: AC
  Administered 2016-03-08: 1.5 mg via TRANSDERMAL
  Filled 2016-03-08: qty 1

## 2016-03-08 MED ORDER — HYDROCODONE-ACETAMINOPHEN 7.5-325 MG PO TABS: 1.0000 | ORAL_TABLET | Freq: Once | ORAL | Status: DC | PRN

## 2016-03-08 MED ORDER — ONDANSETRON HCL 4 MG/2ML IJ SOLN: 4.0000 mg | Freq: Once | INTRAMUSCULAR | Status: DC | PRN

## 2016-03-08 MED ORDER — SUCCINYLCHOLINE CHLORIDE 20 MG/ML IJ SOLN
INTRAMUSCULAR | Status: AC
  Filled 2016-03-08: qty 1

## 2016-03-08 MED ORDER — MIDAZOLAM HCL 2 MG/2ML IJ SOLN
INTRAMUSCULAR | Status: AC
  Filled 2016-03-08: qty 2

## 2016-03-08 MED ORDER — FENTANYL CITRATE (PF) 250 MCG/5ML IJ SOLN
INTRAMUSCULAR | Status: AC
  Filled 2016-03-08: qty 5

## 2016-03-08 MED ORDER — BUPIVACAINE HCL (PF) 0.5 % IJ SOLN
INTRAMUSCULAR | Status: AC
  Filled 2016-03-08: qty 30

## 2016-03-08 MED ORDER — ONDANSETRON HCL 4 MG/2ML IJ SOLN
INTRAMUSCULAR | Status: DC | PRN
  Administered 2016-03-08: 4 mg via INTRAVENOUS

## 2016-03-08 MED ORDER — FENTANYL CITRATE (PF) 100 MCG/2ML IJ SOLN
INTRAMUSCULAR | Status: AC
  Filled 2016-03-08: qty 2

## 2016-03-08 MED ORDER — LIDOCAINE HCL (CARDIAC) 20 MG/ML IV SOLN
INTRAVENOUS | Status: AC
  Filled 2016-03-08: qty 5

## 2016-03-08 MED ORDER — CEFAZOLIN SODIUM-DEXTROSE 2-3 GM-% IV SOLR
INTRAVENOUS | Status: AC
  Filled 2016-03-08: qty 50

## 2016-03-08 MED ORDER — LACTATED RINGERS IV SOLN
INTRAVENOUS | Status: DC
  Administered 2016-03-08: 125 mL/h via INTRAVENOUS
  Administered 2016-03-08: 11:00:00 via INTRAVENOUS

## 2016-03-08 MED ORDER — ONDANSETRON HCL 4 MG/2ML IJ SOLN
INTRAMUSCULAR | Status: AC
  Filled 2016-03-08: qty 2

## 2016-03-08 MED ORDER — FENTANYL CITRATE (PF) 100 MCG/2ML IJ SOLN
INTRAMUSCULAR | Status: DC | PRN
  Administered 2016-03-08 (×2): 50 ug via INTRAVENOUS
  Administered 2016-03-08: 150 ug via INTRAVENOUS

## 2016-03-08 MED ORDER — IBUPROFEN 600 MG PO TABS
600.0000 mg | ORAL_TABLET | Freq: Four times a day (QID) | ORAL | Status: DC | PRN
Start: 2016-03-08 — End: 2018-05-04

## 2016-03-08 MED ORDER — MIDAZOLAM HCL 5 MG/5ML IJ SOLN
INTRAMUSCULAR | Status: DC | PRN
  Administered 2016-03-08: 2 mg via INTRAVENOUS

## 2016-03-08 MED ORDER — SCOPOLAMINE 1 MG/3DAYS TD PT72
1.0000 | MEDICATED_PATCH | Freq: Once | TRANSDERMAL | Status: DC
  Administered 2016-03-08: 1.5 mg via TRANSDERMAL

## 2016-03-08 MED ORDER — KETOROLAC TROMETHAMINE 30 MG/ML IJ SOLN
INTRAMUSCULAR | Status: DC | PRN
  Administered 2016-03-08: 30 mg via INTRAVENOUS

## 2016-03-08 MED ORDER — KETOROLAC TROMETHAMINE 30 MG/ML IJ SOLN
INTRAMUSCULAR | Status: AC
  Filled 2016-03-08: qty 1

## 2016-03-08 MED ORDER — MEPERIDINE HCL 25 MG/ML IJ SOLN: 6.2500 mg | INTRAMUSCULAR | Status: DC | PRN

## 2016-03-08 MED ORDER — LIDOCAINE HCL (CARDIAC) 20 MG/ML IV SOLN
INTRAVENOUS | Status: DC | PRN
  Administered 2016-03-08: 100 mg via INTRAVENOUS

## 2016-03-08 MED ORDER — ROCURONIUM BROMIDE 100 MG/10ML IV SOLN
INTRAVENOUS | Status: AC
  Filled 2016-03-08: qty 1

## 2016-03-08 MED ORDER — CEFAZOLIN SODIUM-DEXTROSE 2-4 GM/100ML-% IV SOLN
2.0000 g | INTRAVENOUS | Status: AC
  Administered 2016-03-08: 2 g via INTRAVENOUS
  Filled 2016-03-08: qty 100

## 2016-03-08 MED ORDER — OXYCODONE-ACETAMINOPHEN 5-325 MG PO TABS
1.0000 | ORAL_TABLET | Freq: Four times a day (QID) | ORAL | Status: AC | PRN
Start: 2016-03-08 — End: ?

## 2016-03-08 MED ORDER — KETOROLAC TROMETHAMINE 30 MG/ML IJ SOLN: 30.0000 mg | Freq: Once | INTRAMUSCULAR | Status: DC

## 2016-03-08 MED ORDER — FENTANYL CITRATE (PF) 100 MCG/2ML IJ SOLN
25.0000 ug | INTRAMUSCULAR | Status: DC | PRN
  Administered 2016-03-08: 25 ug via INTRAVENOUS

## 2016-03-08 MED ORDER — DEXAMETHASONE SODIUM PHOSPHATE 4 MG/ML IJ SOLN
INTRAMUSCULAR | Status: DC | PRN
  Administered 2016-03-08: 4 mg via INTRAVENOUS

## 2016-03-08 MED ORDER — PROPOFOL 10 MG/ML IV BOLUS
INTRAVENOUS | Status: AC
  Filled 2016-03-08: qty 20

## 2016-03-08 MED ORDER — BUPIVACAINE HCL (PF) 0.5 % IJ SOLN
INTRAMUSCULAR | Status: DC | PRN
  Administered 2016-03-08: 10 mL

## 2016-03-08 MED ORDER — SUCCINYLCHOLINE CHLORIDE 20 MG/ML IJ SOLN
INTRAMUSCULAR | Status: DC | PRN
  Administered 2016-03-08: 120 mg via INTRAVENOUS

## 2016-03-08 SURGICAL SUPPLY — 21 items
CATH ROBINSON RED A/P 16FR (CATHETERS) ×3 IMPLANT
CLIP FILSHIE TUBAL LIGA STRL (Clip) ×5 IMPLANT
CLOTH BEACON ORANGE TIMEOUT ST (SAFETY) ×3 IMPLANT
DRSG OPSITE POSTOP 3X4 (GAUZE/BANDAGES/DRESSINGS) ×2 IMPLANT
DURAPREP 26ML APPLICATOR (WOUND CARE) ×3 IMPLANT
GLOVE BIO SURGEON STRL SZ 6.5 (GLOVE) ×2 IMPLANT
GLOVE BIO SURGEONS STRL SZ 6.5 (GLOVE) ×1
GLOVE BIOGEL PI IND STRL 7.0 (GLOVE) ×1 IMPLANT
GLOVE BIOGEL PI INDICATOR 7.0 (GLOVE) ×2
GOWN STRL REUS W/TWL LRG LVL3 (GOWN DISPOSABLE) ×6 IMPLANT
NDL SAFETY ECLIPSE 18X1.5 (NEEDLE) ×1 IMPLANT
NEEDLE HYPO 18GX1.5 SHARP (NEEDLE) ×3
NEEDLE INSUFFLATION 120MM (ENDOMECHANICALS) ×3 IMPLANT
NS IRRIG 1000ML POUR BTL (IV SOLUTION) ×3 IMPLANT
PACK LAPAROSCOPY BASIN (CUSTOM PROCEDURE TRAY) ×3 IMPLANT
PAD TRENDELENBURG POSITION (MISCELLANEOUS) ×3 IMPLANT
SLEEVE XCEL OPT CAN 5 100 (ENDOMECHANICALS) ×3 IMPLANT
SUT VICRYL 0 UR6 27IN ABS (SUTURE) ×3 IMPLANT
SUT VICRYL 4-0 PS2 18IN ABS (SUTURE) ×3 IMPLANT
TOWEL OR 17X24 6PK STRL BLUE (TOWEL DISPOSABLE) ×6 IMPLANT
TROCAR XCEL DIL TIP R 11M (ENDOMECHANICALS) ×3 IMPLANT

## 2016-03-08 NOTE — Transfer of Care (Signed)
Immediate Anesthesia Transfer of Care Note  Patient: Karen Mckinney  Procedure(s) Performed: Procedure(s): LAPAROSCOPIC BILATERAL TUBAL LIGATION (Bilateral)  Patient Location: PACU  Anesthesia Type:General  Level of Consciousness: awake, alert  and oriented  Airway & Oxygen Therapy: Patient Spontanous Breathing and Patient connected to nasal cannula oxygen  Post-op Assessment: Report given to RN  Post vital signs: Reviewed  Last Vitals:  Filed Vitals:   03/08/16 0838  BP: 123/78  Temp: 37.2 C  Resp: 16    Complications: No apparent anesthesia complications

## 2016-03-08 NOTE — Brief Op Note (Addendum)
03/08/2016  11:15 AM  PATIENT:  Karen Mckinney  30 y.o. female  PRE-OPERATIVE DIAGNOSIS:   Undesired fertility  POST-OPERATIVE DIAGNOSIS:   Undesired fertility  PROCEDURE:  Procedure(s): LAPAROSCOPIC BILATERAL TUBAL LIGATION (Bilateral)   FINDINGS: dense adhesions of the uterus to the anterior abdominal wall  SURGEON:  Surgeon(s) and Role:    * Allie BossierMyra C Alajah Witman, MD - Primary  ASSISTANTS: Felton ClintonKali XU- MS3  ANESTHESIA:   general  EBL:  Total I/O In: 1000 [I.V.:1000] Out: -   BLOOD ADMINISTERED:none  DRAINS: none   LOCAL MEDICATIONS USED:  MARCAINE     SPECIMEN:  No Specimen  DISPOSITION OF SPECIMEN:  N/A  COUNTS:  YES  TOURNIQUET:  * No tourniquets in log *  DICTATION: .Dragon Dictation  PLAN OF CARE: Discharge to home after PACU  PATIENT DISPOSITION:  PACU - hemodynamically stable.   Delay start of Pharmacological VTE agent (>24hrs) due to surgical blood loss or risk of bleeding: not applicable   The risks, benefits, alternatives of surgery were explained understood, accepted. All questions were answered. She understands the failure rate of 1/200. She declines alternative forms of birth control. Her urine pregnancy test was negative. She was taken to the operating room and general anesthesia was applied without complication. She was placed in dorsal lithotomy position. Her abdomen and vagina were prepped and draped in the usual sterile fashion. A time out procedure was done. A bimanual exam revealed a normal, size and shape mobile uterus with nonenlarged adnexa. A Hulka manipulator was placed on the cervix. Gloves were changed and attention was turned to the abdomen. Approximately 10 mL of 0.5% Marcaine was used to infiltrate the subcutaneous tissue at the umbilicus. A vertical 1 cm incision was made. A Veres needle was placed in the pelvis. Low-flow CO2 was used to insufflate the abdomen to approximately 3 L. After good pneumoperitoneum was established, a 11 mm trocar was  placed. Laparoscopy confirmed correct placement. She was placed in the Trendelenburg position. Patient abdominal pressure was always less than 15. Her uterus ovaries and tubes appeared normal, with the exception that the uterus was almost completely adherent to the anterior abdominal wall. She had a vertical c/w stat about 8 weeks ago. There was no evidence of endometriosis. The oviducts were visually traced to their fimbriated end on each side. In the isthmic region of each oviduct, a Filshie clip was placed across the entire oviduct. There was no bleeding. The CO2 was allowed to escape from her abdomen. The umbilical fascia was closed with a figure of 0 Vicryl suture. No defects were palpable. The subcuticular closure was done with 4-0 Vicryl suture. The Hulka manipulator was removed. She was extubated and taken to recovery in stable condition. She tolerated the procedure well. The instrument, sponge, and needle counts were correct.

## 2016-03-08 NOTE — Discharge Instructions (Signed)
Laparoscopic Tubal Ligation, Care After No Motrin until after 4:30 pm today.  Refer to this sheet in the next few weeks. These instructions provide you with information about caring for yourself after your procedure. Your health care provider may also give you more specific instructions. Your treatment has been planned according to current medical practices, but problems sometimes occur. Call your health care provider if you have any problems or questions after your procedure. WHAT TO EXPECT AFTER THE PROCEDURE After your procedure, it is common to have:  Sore throat.  Soreness at the incision site.  Mild cramping.  Tiredness.  Mild nausea or vomiting.  Shoulder pain. HOME CARE INSTRUCTIONS  Rest for the remainder of the day.  Take medicines only as directed by your health care provider. These include over-the-counter medicines and prescription medicines. Do not take aspirin, which can cause bleeding.  Over the next few days, gradually return to your normal activities and your normal diet.  Avoid sexual intercourse for 2 weeks or as directed by your health care provider.  Do not use tampons, and do not douche.  Do not drive or operate heavy machinery while taking pain medicine.  Do not lift anything that is heavier than 5 lb (2.3 kg) for 2 weeks or as directed by your health care provider.  Do not take baths. Take showers only. Ask your health care provider when you can start taking baths.  Take your temperature twice each day and write it down.  Try to have help for your household needs for the first 7-10 days.  There are many different ways to close and cover an incision, including stitches (sutures), skin glue, and adhesive strips. Follow instructions from your health care provider about:  Incision care.  Bandage (dressing) changes and removal.  Incision closure removal.  Check your incision area every day for signs of infection. Watch for:  Redness, swelling, or  pain.  Fluid, blood, or pus.  Keep all follow-up visits as directed by your health care provider. SEEK MEDICAL CARE IF:  You have redness, swelling, or increasing pain in your incision area.  You have fluid, blood, or pus coming from your incision for longer than 1 day.  You notice a bad smell coming from your incision or your dressing.  The edges of your incision break open after the sutures have been removed.  Your pain does not decrease after 2-3 days.  You have a rash.  You repeatedly become dizzy or light-headed.  You have a reaction to your medicine.  Your pain medicine is not helping.  You are constipated. SEEK IMMEDIATE MEDICAL CARE IF:  You have a fever.  You faint.  You have increasing pain in your abdomen.  You have severe pain in one or both of your shoulders.  You have bleeding or drainage from your suture sites or your vagina after surgery.  You have shortness of breath or have difficulty breathing.  You have chest pain or leg pain.  You have ongoing nausea, vomiting, or diarrhea.   This information is not intended to replace advice given to you by your health care provider. Make sure you discuss any questions you have with your health care provider.   Document Released: 06/22/2005 Document Revised: 04/19/2015 Document Reviewed: 03/15/2012 Elsevier Interactive Patient Education 2016 [employer]  DO NOT HAVE UNPROTECTED SEX UNTIL AFTER YOUR NEXT MENSTRUAL PERIOD.
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
## 2016-03-08 NOTE — Anesthesia Postprocedure Evaluation (Signed)
Anesthesia Post Note  Patient: Karen Mckinney  Procedure(s) Performed: Procedure(s) (LRB): LAPAROSCOPIC BILATERAL TUBAL LIGATION (Bilateral)  Patient location during evaluation: PACU Anesthesia Type: General Level of consciousness: awake Pain management: pain level controlled Vital Signs Assessment: post-procedure vital signs reviewed and stable Respiratory status: spontaneous breathing Cardiovascular status: stable Postop Assessment: no signs of nausea or vomiting Anesthetic complications: no    Last Vitals:  Filed Vitals:   03/08/16 1130 03/08/16 1145  BP: 129/83 136/87  Pulse: 99 97  Temp:    Resp: 18 18    Last Pain:  Filed Vitals:   03/08/16 1155  PainSc: 7                  Eriona Kinchen JR,JOHN Nyeem Stoke

## 2016-03-08 NOTE — Anesthesia Procedure Notes (Signed)
Procedure Name: Intubation Date/Time: 03/08/2016 10:30 AM Performed by: Jhonnie GarnerMARSHALL, Quintyn Dombek M Pre-anesthesia Checklist: Patient identified, Emergency Drugs available, Suction available and Patient being monitored Patient Re-evaluated:Patient Re-evaluated prior to inductionOxygen Delivery Method: Circle system utilized Preoxygenation: Pre-oxygenation with 100% oxygen Intubation Type: IV induction Ventilation: Mask ventilation without difficulty Laryngoscope Size: Mac and 3 Grade View: Grade II Tube type: Oral Tube size: 7.0 mm Number of attempts: 1 Airway Equipment and Method: Stylet Placement Confirmation: ETT inserted through vocal cords under direct vision,  positive ETCO2 and breath sounds checked- equal and bilateral Secured at: 20 cm Tube secured with: Tape Dental Injury: Teeth and Oropharynx as per pre-operative assessment

## 2016-03-10 ENCOUNTER — Encounter (HOSPITAL_COMMUNITY): Payer: Self-pay | Admitting: Obstetrics & Gynecology

## 2016-04-13 ENCOUNTER — Ambulatory Visit: Payer: Medicaid Other | Admitting: Obstetrics & Gynecology

## 2016-04-13 ENCOUNTER — Encounter: Payer: Self-pay | Admitting: Obstetrics & Gynecology

## 2016-04-13 VITALS — BP 134/69 | HR 73 | Wt 212.6 lb

## 2016-04-13 DIAGNOSIS — Z9889 Other specified postprocedural states: Secondary | ICD-10-CM

## 2016-04-16 NOTE — Progress Notes (Signed)
   Subjective:    Patient ID: Karen Mckinney, female    DOB: 03/01/1986, 30 y.o.   MRN: [id number]  HPI  30 yo here for a 6 week post op visit. She had a laparoscopic BTL (Filsche clips). She has no complaints. She has had a period since surgery.  Review of Systems     Objective:   Physical Exam [id number] Breathing, conversing, and ambulating normally Abd- benign Incisions- healed well       Assessment & Plan:  Post op- doing well RTC for annual exam

## 2016-09-25 ENCOUNTER — Encounter (INDEPENDENT_AMBULATORY_CARE_PROVIDER_SITE_OTHER): Payer: Self-pay | Admitting: *Deleted

## 2018-05-04 ENCOUNTER — Emergency Department (HOSPITAL_COMMUNITY): Payer: Medicaid Other

## 2018-05-04 ENCOUNTER — Emergency Department (HOSPITAL_COMMUNITY)
Admission: EM | Admit: 2018-05-04 | Discharge: 2018-05-04 | Disposition: A | Discharge status: Home / Self Care | Payer: Medicaid Other | Attending: Emergency Medicine | Admitting: Emergency Medicine

## 2018-05-04 ENCOUNTER — Encounter (HOSPITAL_COMMUNITY): Payer: Self-pay | Admitting: Emergency Medicine

## 2018-05-04 ENCOUNTER — Other Ambulatory Visit: Payer: Self-pay

## 2018-05-04 DIAGNOSIS — Z79899 Other long term (current) drug therapy: Secondary | ICD-10-CM | POA: Insufficient documentation

## 2018-05-04 DIAGNOSIS — M545 Low back pain, unspecified: Secondary | ICD-10-CM

## 2018-05-04 DIAGNOSIS — Z87891 Personal history of nicotine dependence: Secondary | ICD-10-CM | POA: Insufficient documentation

## 2018-05-04 LAB — URINALYSIS, ROUTINE W REFLEX MICROSCOPIC
Bilirubin Urine: NEGATIVE
GLUCOSE, UA: NEGATIVE mg/dL
Hgb urine dipstick: NEGATIVE
Ketones, ur: NEGATIVE mg/dL
Nitrite: NEGATIVE
PROTEIN: NEGATIVE mg/dL
Specific Gravity, Urine: 1.014 (ref 1.005–1.030)
pH: 6 (ref 5.0–8.0)

## 2018-05-04 LAB — POC URINE PREG, ED: Preg Test, Ur: NEGATIVE

## 2018-05-04 MED ORDER — IBUPROFEN 600 MG PO TABS: 600.0000 mg | ORAL_TABLET | Freq: Four times a day (QID) | ORAL | 0 refills | Status: AC | PRN

## 2018-05-04 MED ORDER — METHOCARBAMOL 500 MG PO TABS: 500.0000 mg | ORAL_TABLET | Freq: Two times a day (BID) | ORAL | 0 refills | Status: AC

## 2018-05-04 NOTE — Discharge Instructions (Signed)
Take Ibuprofen three times daily for the next week. Take this medicine with food. °Take muscle relaxer at bedtime to help you sleep. This medicine makes you drowsy so do not take before driving or work °Use a heating pad for sore muscles - use for 20 minutes several times a day °Return for worsening symptoms ° °
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
## 2018-05-04 NOTE — ED Triage Notes (Signed)
Pt. Stated, Karen Mckinney had lower back pain for 2 days. Something new. Denies any injury

## 2018-05-04 NOTE — ED Notes (Signed)
Pt verbalized understanding discharge instructions and denies any further needs or questions at this time. VS stable, ambulatory and steady gait.
